# Patient Record
Sex: Female | Born: 1960 | Race: White | Hispanic: No | Smoking: Never smoker
Health system: Southern US, Community
[De-identification: ages and names within clinical notes are randomized; demographics above are authoritative.]

## PROBLEM LIST (undated history)

## (undated) DIAGNOSIS — D219 Benign neoplasm of connective and other soft tissue, unspecified: Secondary | ICD-10-CM

## (undated) DIAGNOSIS — R059 Cough, unspecified: Secondary | ICD-10-CM

## (undated) DIAGNOSIS — R000 Tachycardia, unspecified: Secondary | ICD-10-CM

## (undated) DIAGNOSIS — R05 Cough: Secondary | ICD-10-CM

## (undated) DIAGNOSIS — R002 Palpitations: Secondary | ICD-10-CM

## (undated) DIAGNOSIS — R5383 Other fatigue: Secondary | ICD-10-CM

## (undated) DIAGNOSIS — J309 Allergic rhinitis, unspecified: Secondary | ICD-10-CM

## (undated) DIAGNOSIS — R053 Chronic cough: Secondary | ICD-10-CM

## (undated) DIAGNOSIS — D649 Anemia, unspecified: Secondary | ICD-10-CM

## (undated) HISTORY — PX: CHOLECYSTECTOMY: SHX55

## (undated) HISTORY — DX: Tachycardia, unspecified: R00.0

## (undated) HISTORY — DX: Palpitations: R00.2

## (undated) HISTORY — DX: Other fatigue: R53.83

## (undated) HISTORY — DX: Benign neoplasm of connective and other soft tissue, unspecified: D21.9

## (undated) HISTORY — DX: Allergic rhinitis, unspecified: J30.9

## (undated) HISTORY — PX: WISDOM TOOTH EXTRACTION: SHX21

## (undated) HISTORY — DX: Cough: R05

## (undated) HISTORY — DX: Cough, unspecified: R05.9

---

## 1998-03-08 ENCOUNTER — Observation Stay (HOSPITAL_COMMUNITY): Admission: EM | Admit: 1998-03-08 | Discharge: 1998-03-09 | Payer: Self-pay | Admitting: Gastroenterology

## 1999-04-20 ENCOUNTER — Encounter: Payer: Self-pay | Admitting: Infectious Diseases

## 1999-04-20 ENCOUNTER — Ambulatory Visit (HOSPITAL_COMMUNITY): Admission: RE | Admit: 1999-04-20 | Discharge: 1999-04-20 | Payer: Self-pay | Admitting: Infectious Diseases

## 1999-04-20 ENCOUNTER — Encounter: Admission: RE | Admit: 1999-04-20 | Discharge: 1999-04-20 | Payer: Self-pay | Admitting: Infectious Diseases

## 1999-04-28 ENCOUNTER — Encounter: Admission: RE | Admit: 1999-04-28 | Discharge: 1999-04-28 | Payer: Self-pay | Admitting: Infectious Diseases

## 1999-05-11 ENCOUNTER — Encounter: Admission: RE | Admit: 1999-05-11 | Discharge: 1999-05-11 | Payer: Self-pay | Admitting: Infectious Diseases

## 1999-05-11 ENCOUNTER — Ambulatory Visit (HOSPITAL_COMMUNITY): Admission: RE | Admit: 1999-05-11 | Discharge: 1999-05-11 | Payer: Self-pay | Admitting: Infectious Diseases

## 2004-10-09 ENCOUNTER — Other Ambulatory Visit: Admission: RE | Admit: 2004-10-09 | Discharge: 2004-10-09 | Payer: Self-pay | Admitting: Obstetrics and Gynecology

## 2005-10-16 ENCOUNTER — Other Ambulatory Visit: Admission: RE | Admit: 2005-10-16 | Discharge: 2005-10-16 | Payer: Self-pay | Admitting: Obstetrics and Gynecology

## 2007-02-11 ENCOUNTER — Other Ambulatory Visit: Admission: RE | Admit: 2007-02-11 | Discharge: 2007-02-11 | Payer: Self-pay | Admitting: Obstetrics and Gynecology

## 2008-09-07 ENCOUNTER — Other Ambulatory Visit: Admission: RE | Admit: 2008-09-07 | Discharge: 2008-09-07 | Payer: Self-pay | Admitting: Obstetrics and Gynecology

## 2009-05-30 ENCOUNTER — Encounter: Admission: RE | Admit: 2009-05-30 | Discharge: 2009-05-30 | Payer: Self-pay | Admitting: Family Medicine

## 2009-05-30 IMAGING — CR DG CHEST 2V
2 series · 2 of 2 positions shown · non-contrast
Comparison: None

CLINICAL DATA: Cough

CHEST - 2 VIEW

[view not recorded (1 of 2)]
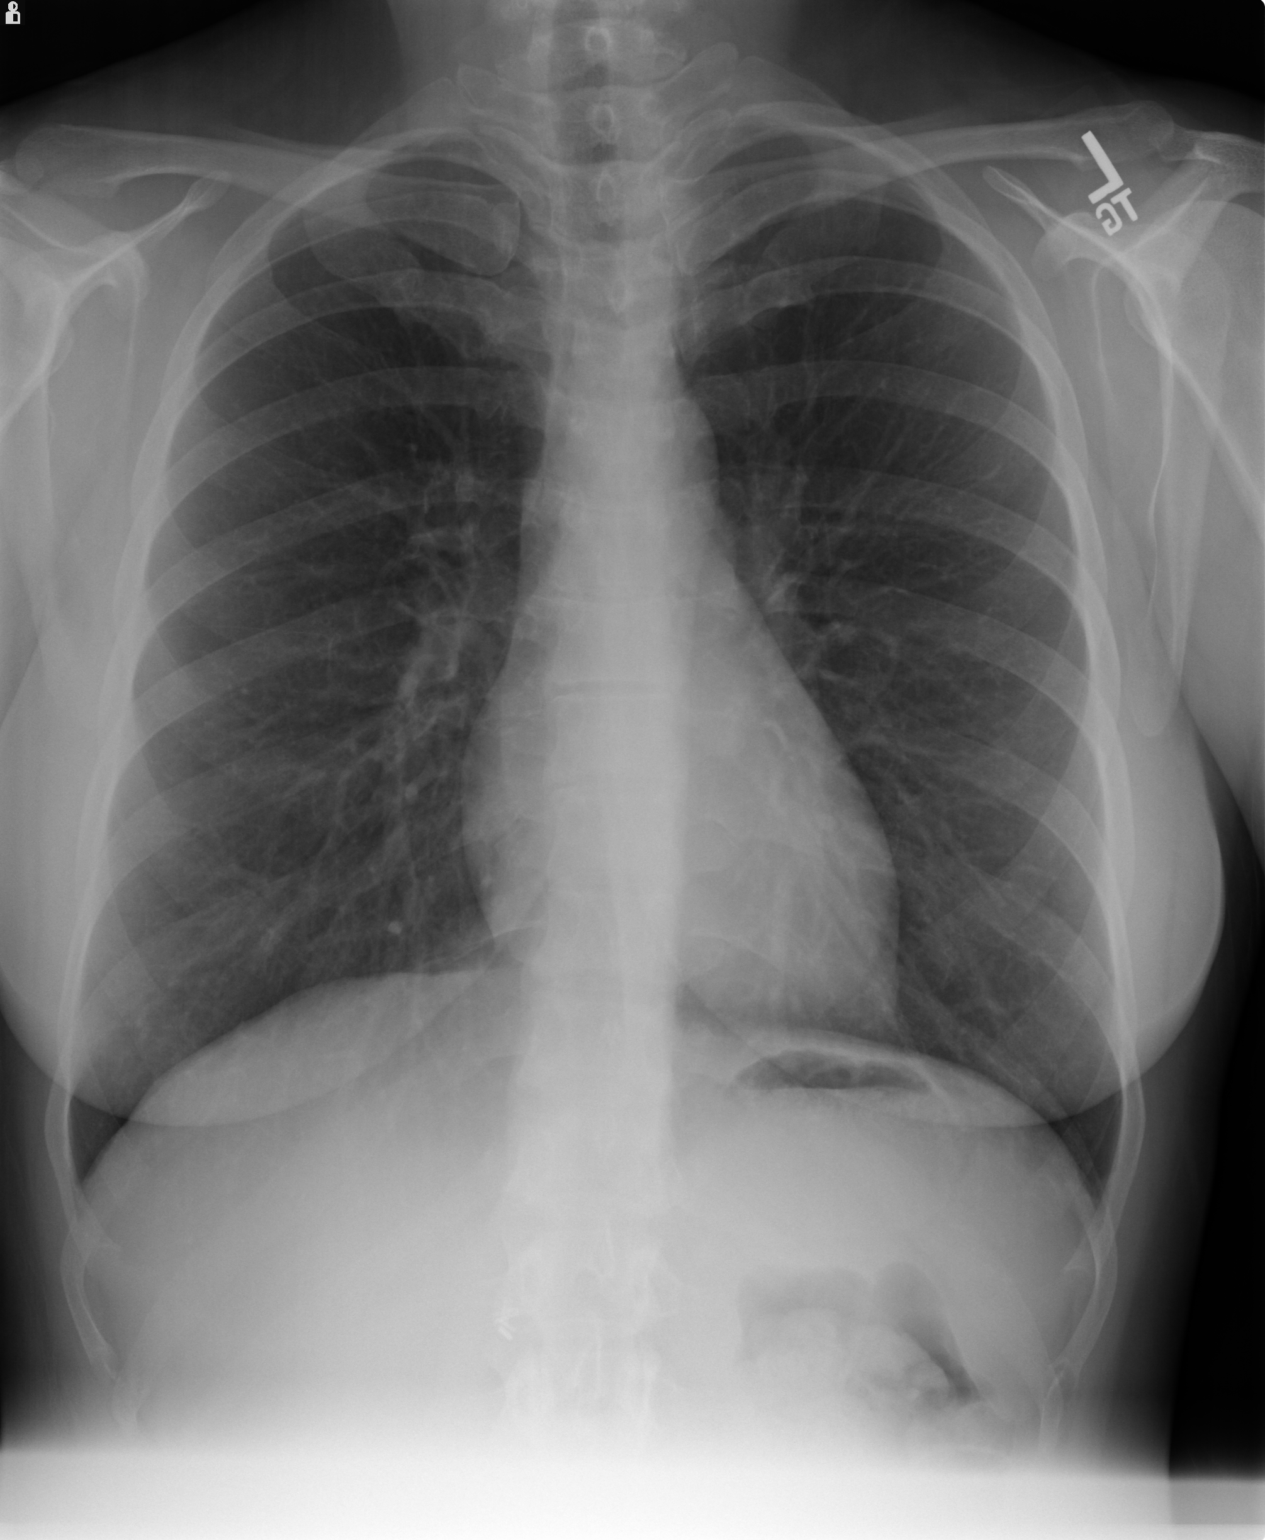

[view not recorded (2 of 2)]
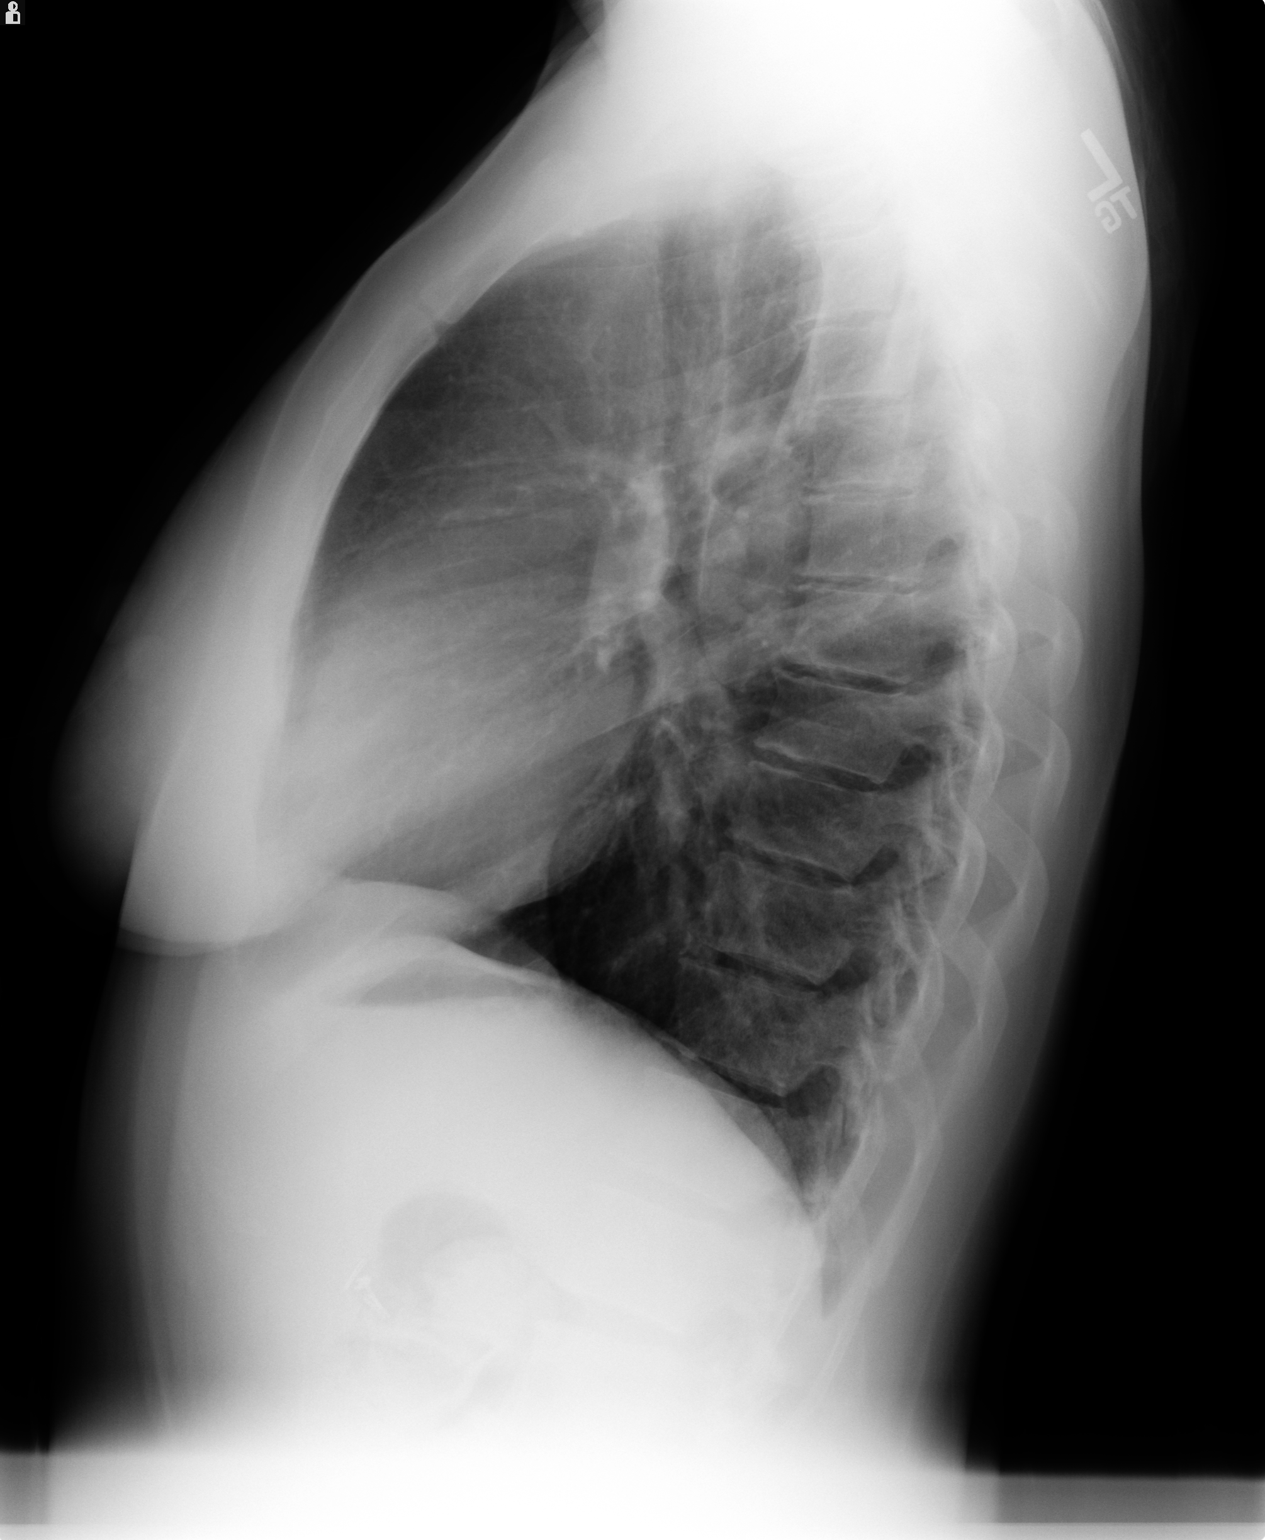

[2 of 2 positions shown; findings below may reference images not displayed]

FINDINGS: The heart size and mediastinal contours are within normal
limits.  Both lungs are clear.  The visualized skeletal structures
are unremarkable.
IMPRESSION: No acute cardiopulmonary abnormalities.

## 2009-12-21 ENCOUNTER — Other Ambulatory Visit: Admission: RE | Admit: 2009-12-21 | Discharge: 2009-12-21 | Payer: Self-pay | Admitting: Obstetrics and Gynecology

## 2010-10-22 ENCOUNTER — Encounter: Payer: Self-pay | Admitting: Obstetrics and Gynecology

## 2010-12-26 ENCOUNTER — Other Ambulatory Visit: Payer: Self-pay | Admitting: Nurse Practitioner

## 2010-12-26 ENCOUNTER — Other Ambulatory Visit (HOSPITAL_COMMUNITY)
Admission: RE | Admit: 2010-12-26 | Discharge: 2010-12-26 | Disposition: A | Discharge status: Home / Self Care | Payer: BC Managed Care – PPO | Source: Ambulatory Visit | Attending: Obstetrics and Gynecology | Admitting: Obstetrics and Gynecology

## 2010-12-26 DIAGNOSIS — Z01419 Encounter for gynecological examination (general) (routine) without abnormal findings: Secondary | ICD-10-CM | POA: Insufficient documentation

## 2011-05-08 ENCOUNTER — Other Ambulatory Visit: Payer: Self-pay | Admitting: Obstetrics and Gynecology

## 2011-05-08 DIAGNOSIS — Z1231 Encounter for screening mammogram for malignant neoplasm of breast: Secondary | ICD-10-CM

## 2011-05-14 ENCOUNTER — Ambulatory Visit
Admission: RE | Admit: 2011-05-14 | Discharge: 2011-05-14 | Disposition: A | Discharge status: Home / Self Care | Payer: Commercial Indemnity | Source: Ambulatory Visit | Attending: Obstetrics and Gynecology | Admitting: Obstetrics and Gynecology

## 2011-05-14 DIAGNOSIS — Z1231 Encounter for screening mammogram for malignant neoplasm of breast: Secondary | ICD-10-CM

## 2012-05-01 ENCOUNTER — Other Ambulatory Visit (HOSPITAL_COMMUNITY)
Admission: RE | Admit: 2012-05-01 | Discharge: 2012-05-01 | Disposition: A | Discharge status: Home / Self Care | Payer: Managed Care, Other (non HMO) | Source: Ambulatory Visit | Attending: Obstetrics and Gynecology | Admitting: Obstetrics and Gynecology

## 2012-05-01 ENCOUNTER — Other Ambulatory Visit: Payer: Self-pay | Admitting: Nurse Practitioner

## 2012-05-01 DIAGNOSIS — Z01419 Encounter for gynecological examination (general) (routine) without abnormal findings: Secondary | ICD-10-CM | POA: Insufficient documentation

## 2012-11-17 ENCOUNTER — Ambulatory Visit
Admission: RE | Admit: 2012-11-17 | Discharge: 2012-11-17 | Disposition: A | Discharge status: Home / Self Care | Payer: Managed Care, Other (non HMO) | Source: Ambulatory Visit | Attending: Family Medicine | Admitting: Family Medicine

## 2012-11-17 ENCOUNTER — Other Ambulatory Visit: Payer: Self-pay | Admitting: Family Medicine

## 2012-11-17 DIAGNOSIS — R1031 Right lower quadrant pain: Secondary | ICD-10-CM

## 2012-11-17 DIAGNOSIS — R102 Pelvic and perineal pain: Secondary | ICD-10-CM
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
## 2013-07-03 ENCOUNTER — Other Ambulatory Visit: Payer: Self-pay | Admitting: Family Medicine

## 2013-07-03 ENCOUNTER — Ambulatory Visit
Admission: RE | Admit: 2013-07-03 | Discharge: 2013-07-03 | Disposition: A | Discharge status: Home / Self Care | Payer: 59 | Source: Ambulatory Visit | Attending: Family Medicine | Admitting: Family Medicine

## 2013-07-03 DIAGNOSIS — R05 Cough: Secondary | ICD-10-CM

## 2013-07-03 DIAGNOSIS — R059 Cough, unspecified: Secondary | ICD-10-CM

## 2013-07-03 IMAGING — CR DG CHEST 2V
2 series · 2 of 2 positions shown · non-contrast
Comparison: [DATE].

CLINICAL DATA: Worsening cough. Family history of lung cancer.

EXAM:
CHEST  2 VIEW

[view not recorded (1 of 2)]
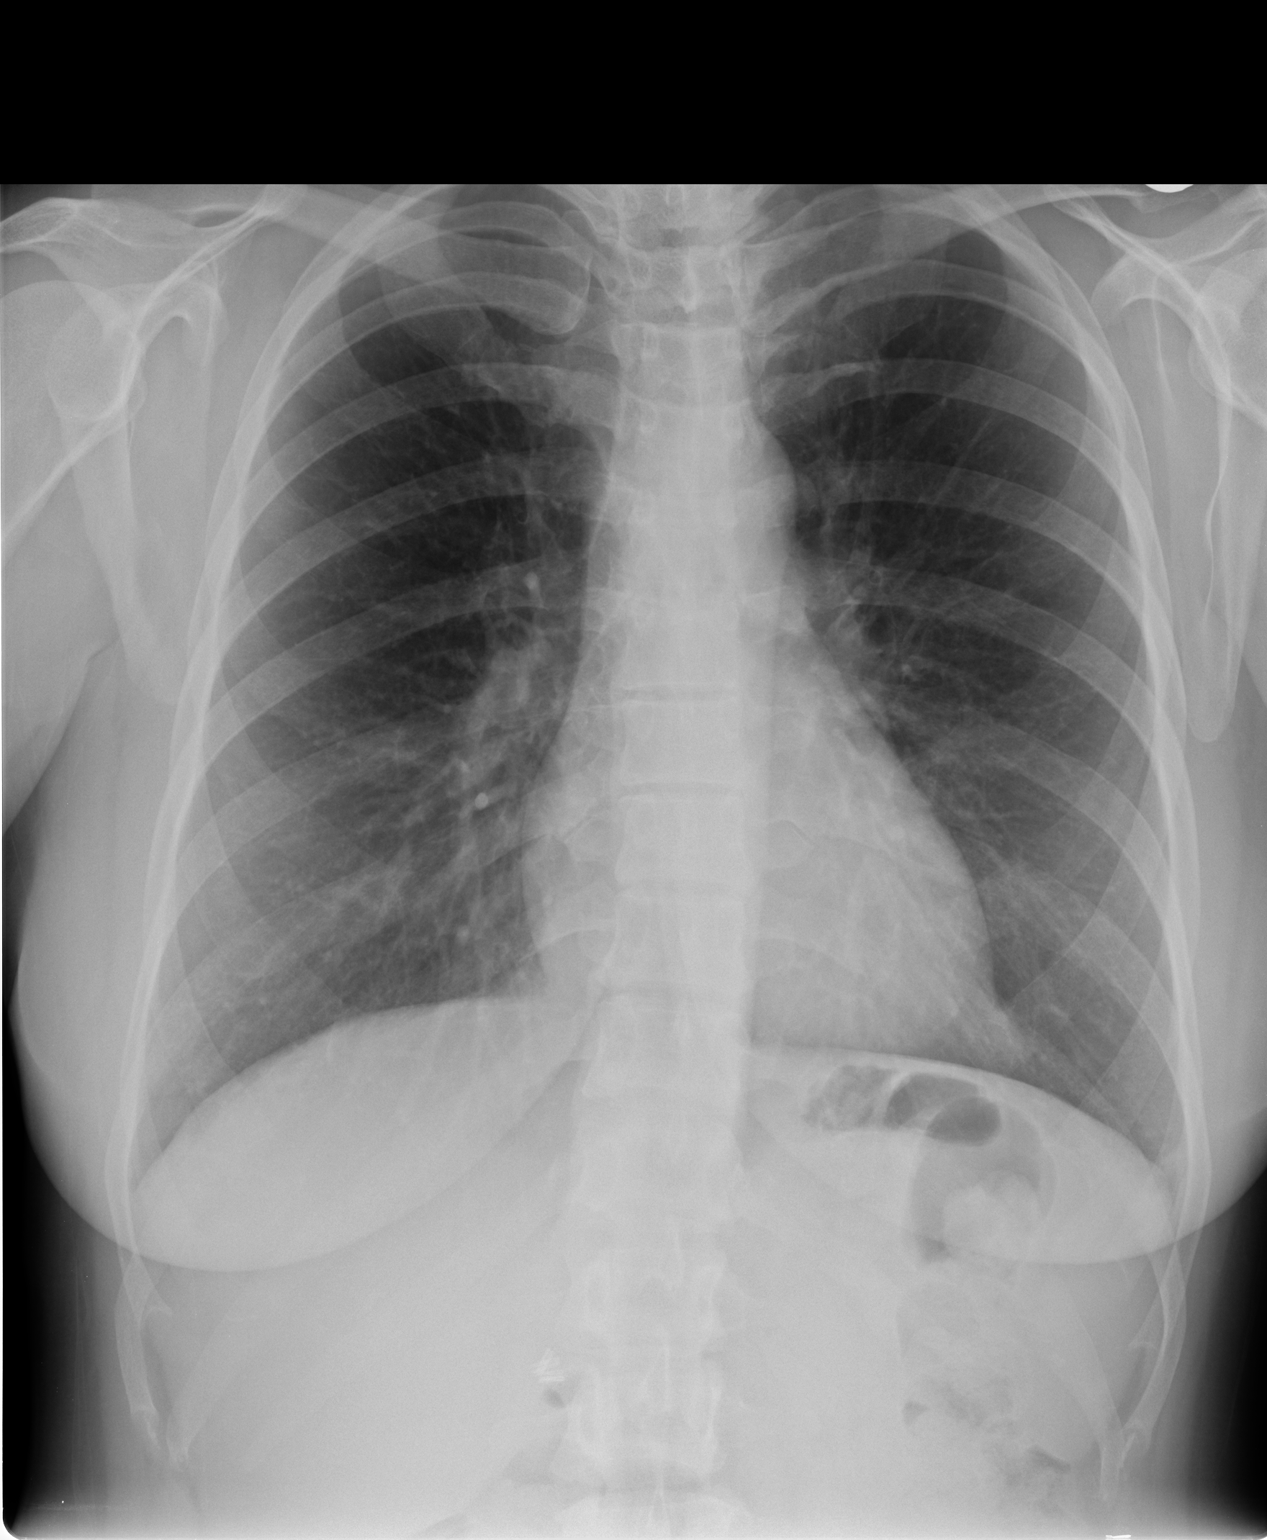

[view not recorded (2 of 2)]
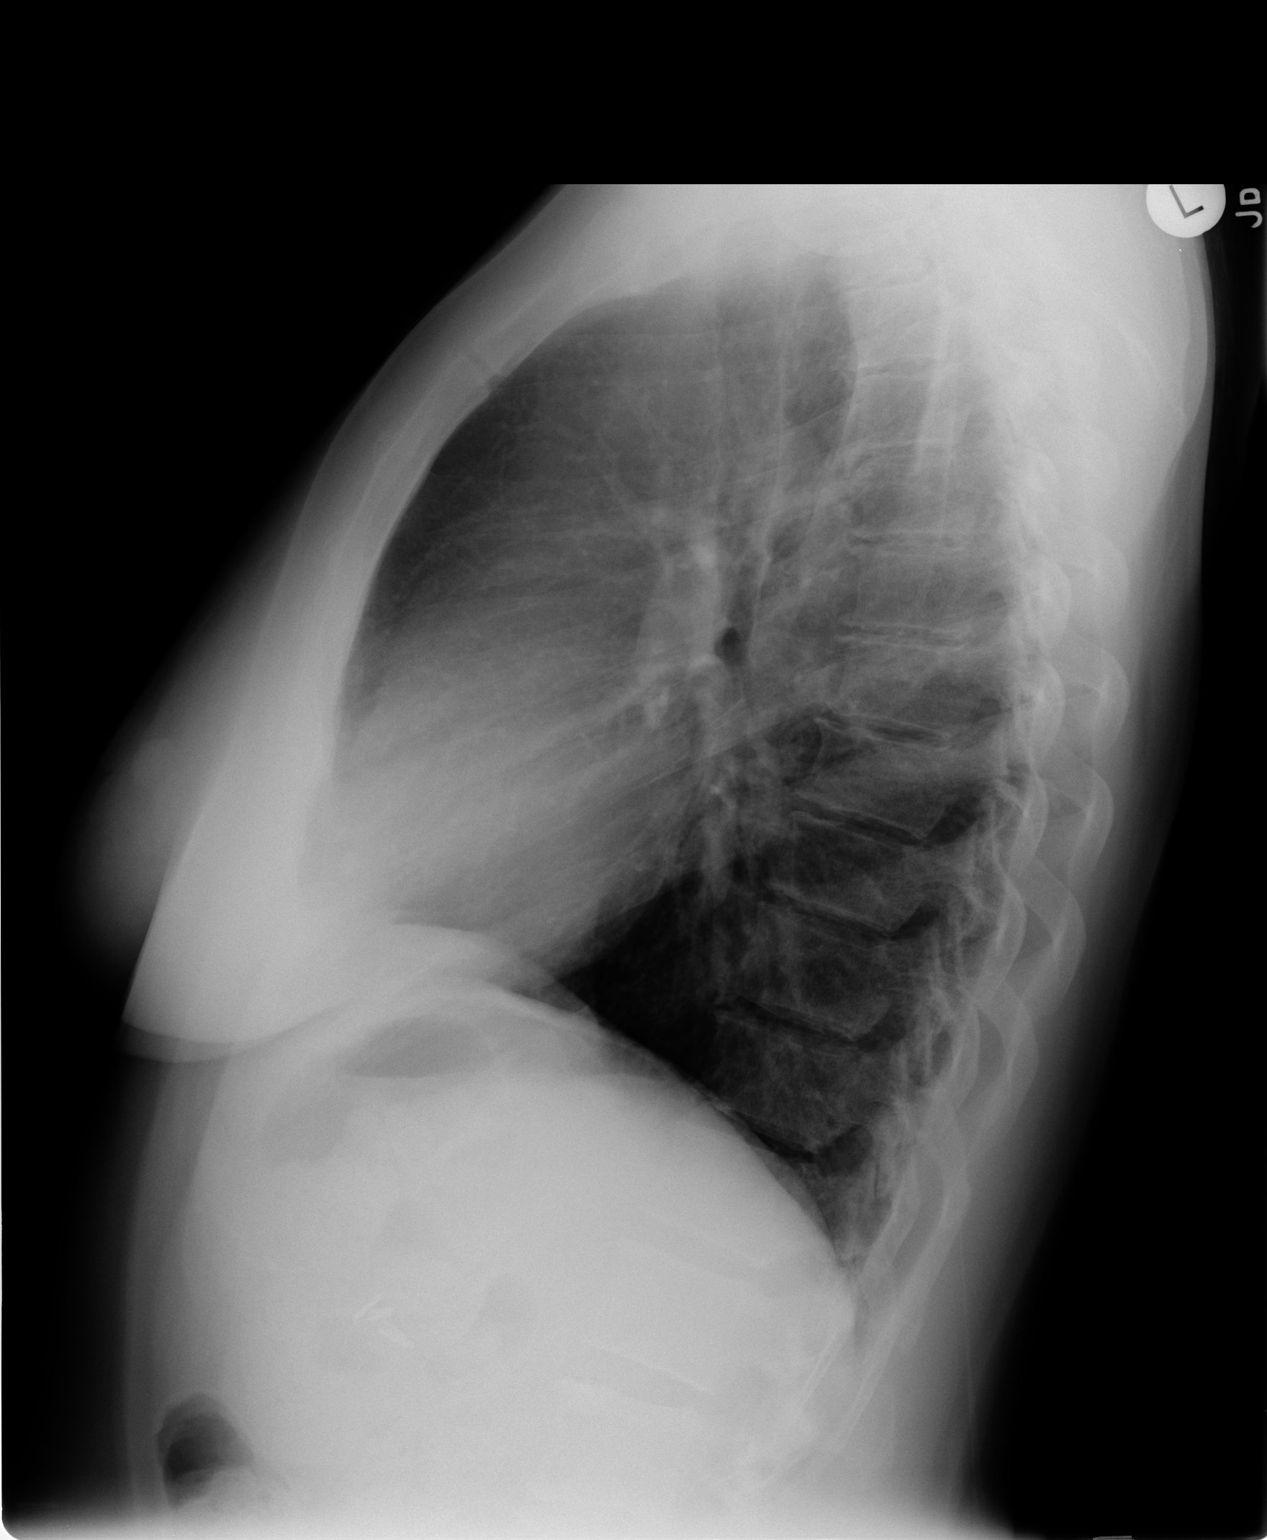

[2 of 2 positions shown; findings below may reference images not displayed]

FINDINGS: Trachea is midline. Heart size normal. Lungs may be mildly
hyperinflated but are clear. No pleural fluid.
IMPRESSION: No acute findings.

## 2013-11-19 ENCOUNTER — Emergency Department (HOSPITAL_COMMUNITY)
Admission: EM | Admit: 2013-11-19 | Discharge: 2013-11-19 | Disposition: A | Discharge status: Home / Self Care | Payer: Managed Care, Other (non HMO) | Attending: Emergency Medicine | Admitting: Emergency Medicine

## 2013-11-19 ENCOUNTER — Encounter (HOSPITAL_COMMUNITY): Payer: Self-pay | Admitting: Emergency Medicine

## 2013-11-19 ENCOUNTER — Emergency Department (HOSPITAL_COMMUNITY): Payer: Managed Care, Other (non HMO)

## 2013-11-19 DIAGNOSIS — S139XXA Sprain of joints and ligaments of unspecified parts of neck, initial encounter: Secondary | ICD-10-CM | POA: Insufficient documentation

## 2013-11-19 DIAGNOSIS — Y92410 Unspecified street and highway as the place of occurrence of the external cause: Secondary | ICD-10-CM | POA: Insufficient documentation

## 2013-11-19 DIAGNOSIS — Y9389 Activity, other specified: Secondary | ICD-10-CM | POA: Insufficient documentation

## 2013-11-19 DIAGNOSIS — S199XXA Unspecified injury of neck, initial encounter: Secondary | ICD-10-CM | POA: Diagnosis present

## 2013-11-19 DIAGNOSIS — R0789 Other chest pain: Secondary | ICD-10-CM

## 2013-11-19 DIAGNOSIS — S298XXA Other specified injuries of thorax, initial encounter: Secondary | ICD-10-CM | POA: Insufficient documentation

## 2013-11-19 DIAGNOSIS — S161XXA Strain of muscle, fascia and tendon at neck level, initial encounter: Secondary | ICD-10-CM

## 2013-11-19 DIAGNOSIS — S0993XA Unspecified injury of face, initial encounter: Secondary | ICD-10-CM | POA: Diagnosis present
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
## 2013-11-19 MED ORDER — DIAZEPAM 5 MG PO TABS: 5.0000 mg | ORAL_TABLET | Freq: Three times a day (TID) | ORAL | Status: DC | PRN

## 2013-11-19 MED ORDER — OXYCODONE-ACETAMINOPHEN 5-325 MG PO TABS: 1.0000 | ORAL_TABLET | ORAL | Status: DC | PRN

## 2013-11-19 MED ORDER — IBUPROFEN 800 MG PO TABS: 800.0000 mg | ORAL_TABLET | Freq: Three times a day (TID) | ORAL | Status: DC | PRN

## 2013-11-19 MED ORDER — OXYCODONE-ACETAMINOPHEN 5-325 MG PO TABS
2.0000 | ORAL_TABLET | Freq: Once | ORAL | Status: AC
  Administered 2013-11-19: 2 via ORAL
  Filled 2013-11-19: qty 2

## 2013-11-19 NOTE — ED Notes (Signed)
Patient transported to X-ray 
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
## 2013-11-19 NOTE — Discharge Instructions (Signed)
Cervical Sprain A cervical sprain is an injury in the neck in which the strong, fibrous tissues (ligaments) that connect your neck bones stretch or tear. Cervical sprains can range from mild to severe. Severe cervical sprains can cause the neck vertebrae to be unstable. This can lead to damage of the spinal cord and can result in serious nervous system problems. The amount of time it takes for a cervical sprain to get better depends on the cause and extent of the injury. Most cervical sprains heal in 1 to 3 weeks. CAUSES  Severe cervical sprains may be caused by:   Contact sport injuries (such as from football, rugby, wrestling, hockey, auto racing, gymnastics, diving, martial arts, or boxing).   Motor vehicle collisions.   Whiplash injuries. This is an injury from a sudden forward-and backward whipping movement of the head and neck.  Falls.  Mild cervical sprains may be caused by:   Being in an awkward position, such as while cradling a telephone between your ear and shoulder.   Sitting in a chair that does not offer proper support.   Working at a poorly Landscape [employer] station.   Looking up or down for long periods of time.  SYMPTOMS   Pain, soreness, stiffness, or a burning sensation in the front, back, or sides of the neck. This discomfort may develop immediately after the injury or slowly, 24 hours or more after the injury.   Pain or tenderness directly in the middle of the back of the neck.   Shoulder or upper back pain.   Limited ability to move the neck.   Headache.   Dizziness.   Weakness, numbness, or tingling in the hands or arms.   Muscle spasms.   Difficulty swallowing or chewing.   Tenderness and swelling of the neck.  DIAGNOSIS  Most of the time your health care provider can diagnose a cervical sprain by taking your history and doing a physical exam. Your health care provider will ask about previous neck injuries and any known neck  problems, such as arthritis in the neck. X-rays may be taken to find out if there are any other problems, such as with the bones of the neck. Other tests, such as a CT scan or MRI, may also be needed.  TREATMENT  Treatment depends on the severity of the cervical sprain. Mild sprains can be treated with rest, keeping the neck in place (immobilization), and pain medicines. Severe cervical sprains are immediately immobilized. Further treatment is done to help with pain, muscle spasms, and other symptoms and may include:  Medicines, such as pain relievers, numbing medicines, or muscle relaxants.   Physical therapy. This may involve stretching exercises, strengthening exercises, and posture training. Exercises and improved posture can help stabilize the neck, strengthen muscles, and help stop symptoms from returning.  HOME CARE INSTRUCTIONS   Put ice on the injured area.   Put ice in a plastic bag.   Place a towel between your skin and the bag.   Leave the ice on for 15 20 minutes, 3 4 times a day.   If your injury was severe, you may have been given a cervical collar to wear. A cervical collar is a two-piece collar designed to keep your neck from moving while it heals.  Do not remove the collar unless instructed by your health care provider.  If you have long hair, keep it outside of the collar.  Ask your health care provider before making any adjustments to your collar.  Minor adjustments may be required over time to improve comfort and reduce pressure on your chin or on the back of your head.  Ifyou are allowed to remove the collar for cleaning or bathing, follow your health care provider's instructions on how to do so safely.  Keep your collar clean by wiping it with mild soap and water and drying it completely. If the collar you have been given includes removable pads, remove them every 1 2 days and hand wash them with soap and water. Allow them to air dry. They should be completely  dry before you wear them in the collar.  If you are allowed to remove the collar for cleaning and bathing, wash and dry the skin of your neck. Check your skin for irritation or sores. If you see any, tell your health care provider.  Do not drive while wearing the collar.   Only take over-the-counter or prescription medicines for pain, discomfort, or fever as directed by your health care provider.   Keep all follow-up appointments as directed by your health care provider.   Keep all physical therapy appointments as directed by your health care provider.   Make any needed adjustments to your workstation to promote good posture.   Avoid positions and activities that make your symptoms worse.   Warm up and stretch before being active to help prevent problems.  SEEK MEDICAL CARE IF:   Your pain is not controlled with medicine.   You are unable to decrease your pain medicine over time as planned.   Your activity level is not improving as expected.  SEEK IMMEDIATE MEDICAL CARE IF:   You develop any bleeding.  You develop stomach upset.  You have signs of an allergic reaction to your medicine.   Your symptoms get worse.   You develop new, unexplained symptoms.   You have numbness, tingling, weakness, or paralysis in any part of your body.  MAKE SURE YOU:   Understand these instructions.  Will watch your condition.  Will get help right away if you are not doing well or get worse. Document Released: 07/15/2007 Document Revised: 07/08/2013 Document Reviewed: 03/25/2013 [facility] Patient Information 2014 [place].  Chest Wall Pain Chest wall pain is pain in or around the bones and muscles of your chest. It may take up to 6 weeks to get better. It may take longer if you must stay physically active in your work and activities.  CAUSES  Chest wall pain may happen on its own. However, it may be caused by:  A viral illness like the  flu.  Injury.  Coughing.  Exercise.  Arthritis.  Fibromyalgia.  Shingles. HOME CARE INSTRUCTIONS   Avoid overtiring physical activity. Try not to strain or perform activities that cause pain. This includes any activities using your chest or your abdominal and side muscles, especially if heavy weights are used.  Put ice on the sore area.  Put ice in a plastic bag.  Place a towel between your skin and the bag.  Leave the ice on for 15-20 minutes per hour while awake for the first 2 days.  Only take over-the-counter or prescription medicines for pain, discomfort, or fever as directed by your caregiver. SEEK IMMEDIATE MEDICAL CARE IF:   Your pain increases, or you are very uncomfortable.  You have a fever.  Your chest pain becomes worse.  You have new, unexplained symptoms.  You have nausea or vomiting.  You feel sweaty or lightheaded.  You have a cough with  phlegm (sputum), or you cough up blood. MAKE SURE YOU:   Understand these instructions.  Will watch your condition.  Will get help right away if you are not doing well or get worse. Document Released: 09/17/2005 Document Revised: 12/10/2011 Document Reviewed: 05/14/2011 [facility] Patient Information 2014 [place], [place].  Motor Vehicle Collision  It is common to have multiple bruises and sore muscles after a motor vehicle collision (MVC). These tend to feel worse for the first 24 hours. You may have the most stiffness and soreness over the first several hours. You may also feel worse when you wake up the first morning after your collision. After this point, you will usually begin to improve with each day. The speed of improvement often depends on the severity of the collision, the number of injuries, and the location and nature of these injuries. HOME CARE INSTRUCTIONS   Put ice on the injured area.  Put ice in a plastic bag.  Place a towel between your skin and the bag.  Leave the ice on for 15-20 minutes,  03-04 times a day.  Drink enough fluids to keep your urine clear or pale yellow. Do not drink alcohol.  Take a warm shower or bath once or twice a day. This will increase blood flow to sore muscles.  You may return to activities as directed by your caregiver. Be careful when lifting, as this may aggravate neck or back pain.  Only take over-the-counter or prescription medicines for pain, discomfort, or fever as directed by your caregiver. Do not use aspirin. This may increase bruising and bleeding. SEEK IMMEDIATE MEDICAL CARE IF:  You have numbness, tingling, or weakness in the arms or legs.  You develop severe headaches not relieved with medicine.  You have severe neck pain, especially tenderness in the middle of the back of your neck.  You have changes in bowel or bladder control.  There is increasing pain in any area of the body.  You have shortness of breath, lightheadedness, dizziness, or fainting.  You have chest pain.  You feel sick to your stomach (nauseous), throw up (vomit), or sweat.  You have increasing abdominal discomfort.  There is blood in your urine, stool, or vomit.  You have pain in your shoulder (shoulder strap areas).  You feel your symptoms are getting worse. MAKE SURE YOU:   Understand these instructions.  Will watch your condition.  Will get help right away if you are not doing well or get worse. Document Released: 09/17/2005 Document Revised: 12/10/2011 Document Reviewed: 02/14/2011 [facility] Patient Information 2014 [place], [place].

## 2014-05-07 ENCOUNTER — Ambulatory Visit
Admission: RE | Admit: 2014-05-07 | Discharge: 2014-05-07 | Disposition: A | Discharge status: Home / Self Care | Payer: Managed Care, Other (non HMO) | Source: Ambulatory Visit | Attending: Family Medicine | Admitting: Family Medicine

## 2014-05-07 ENCOUNTER — Other Ambulatory Visit: Payer: Self-pay | Admitting: Family Medicine

## 2014-05-07 DIAGNOSIS — R059 Cough, unspecified: Secondary | ICD-10-CM

## 2014-05-07 DIAGNOSIS — R05 Cough: Secondary | ICD-10-CM

## 2014-05-07 IMAGING — CR DG CHEST 2V
2 series · 2 of 2 positions shown · non-contrast
Comparison: None.

CLINICAL DATA: Chronic cough, congestion

EXAM:
CHEST  2 VIEW

[w chest pa]
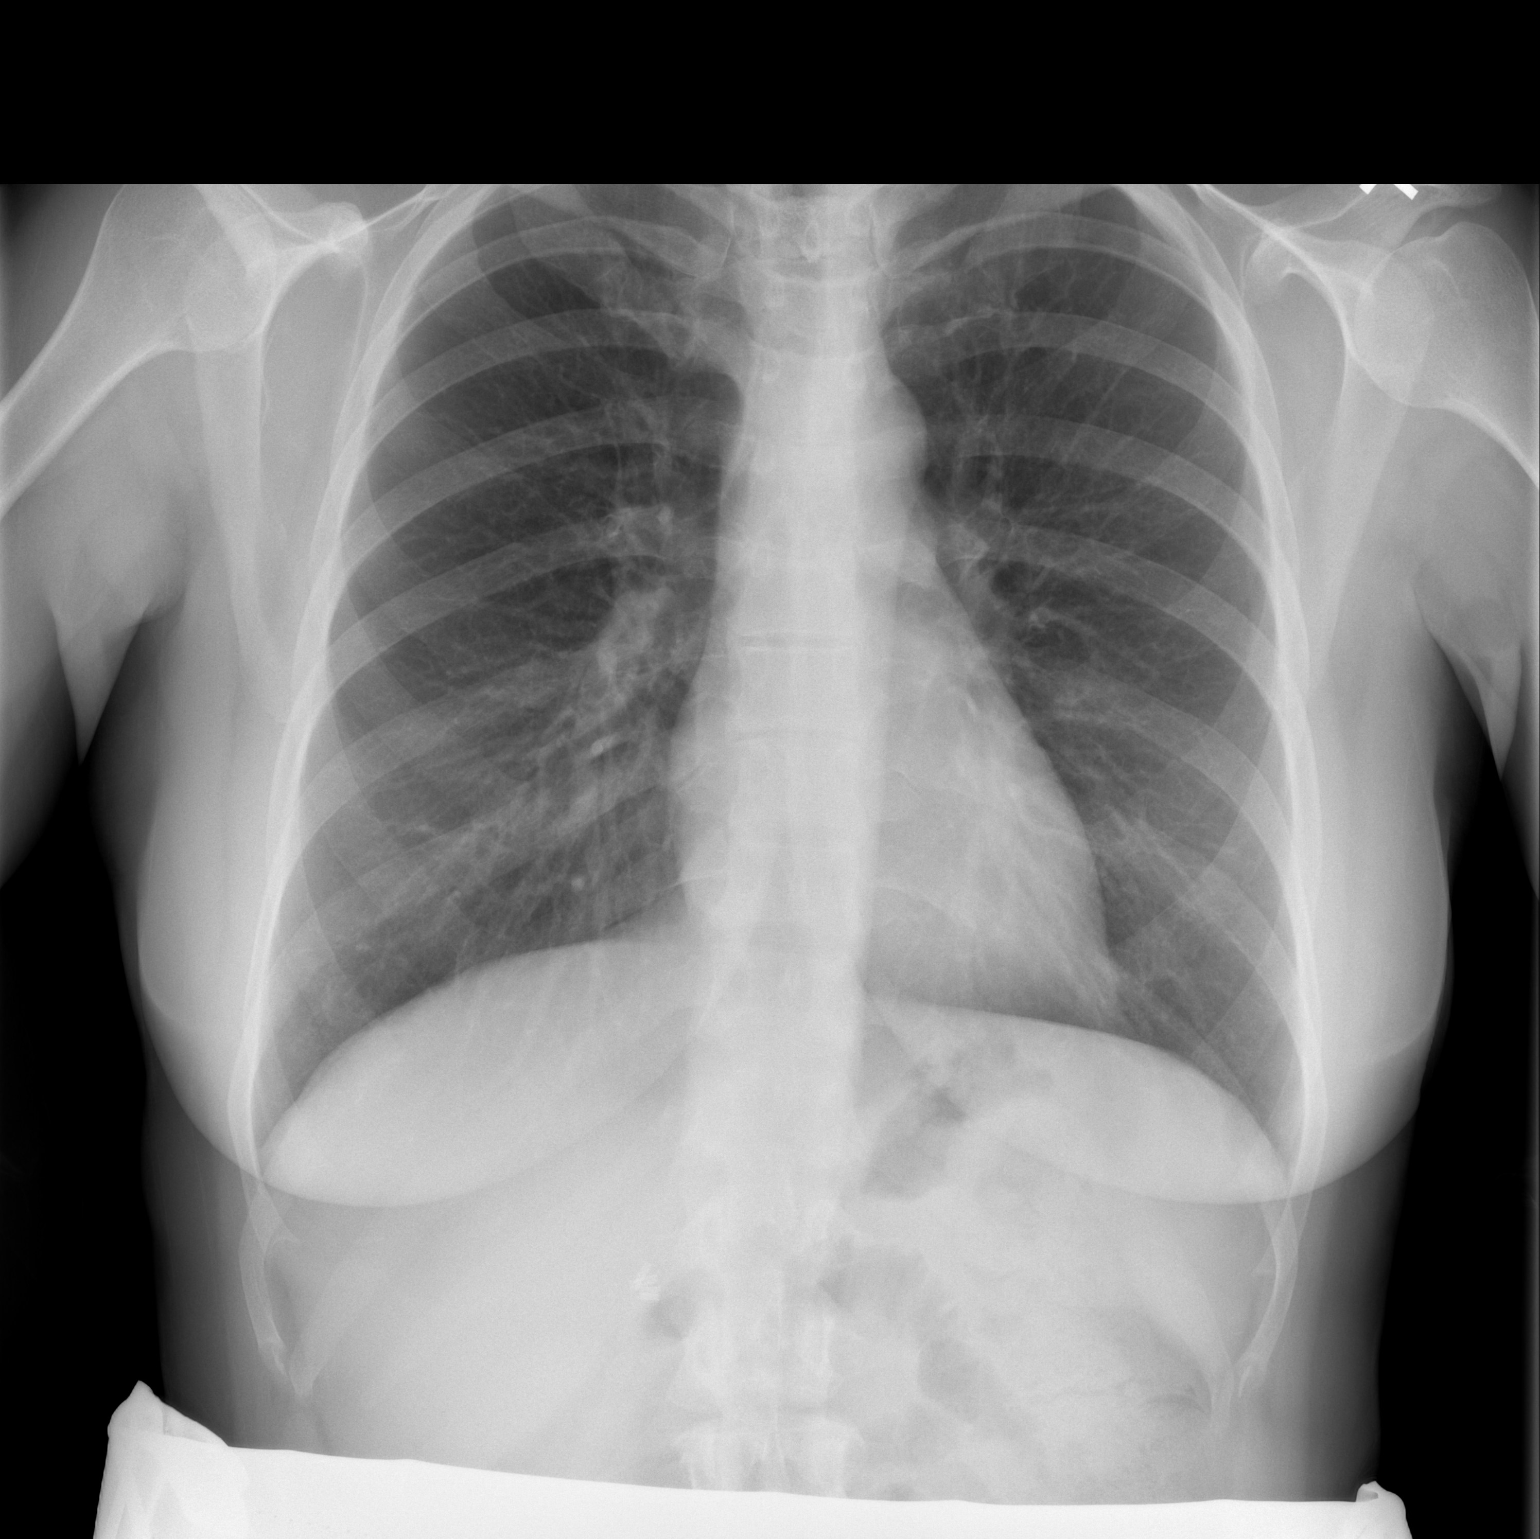

[w chest lat]
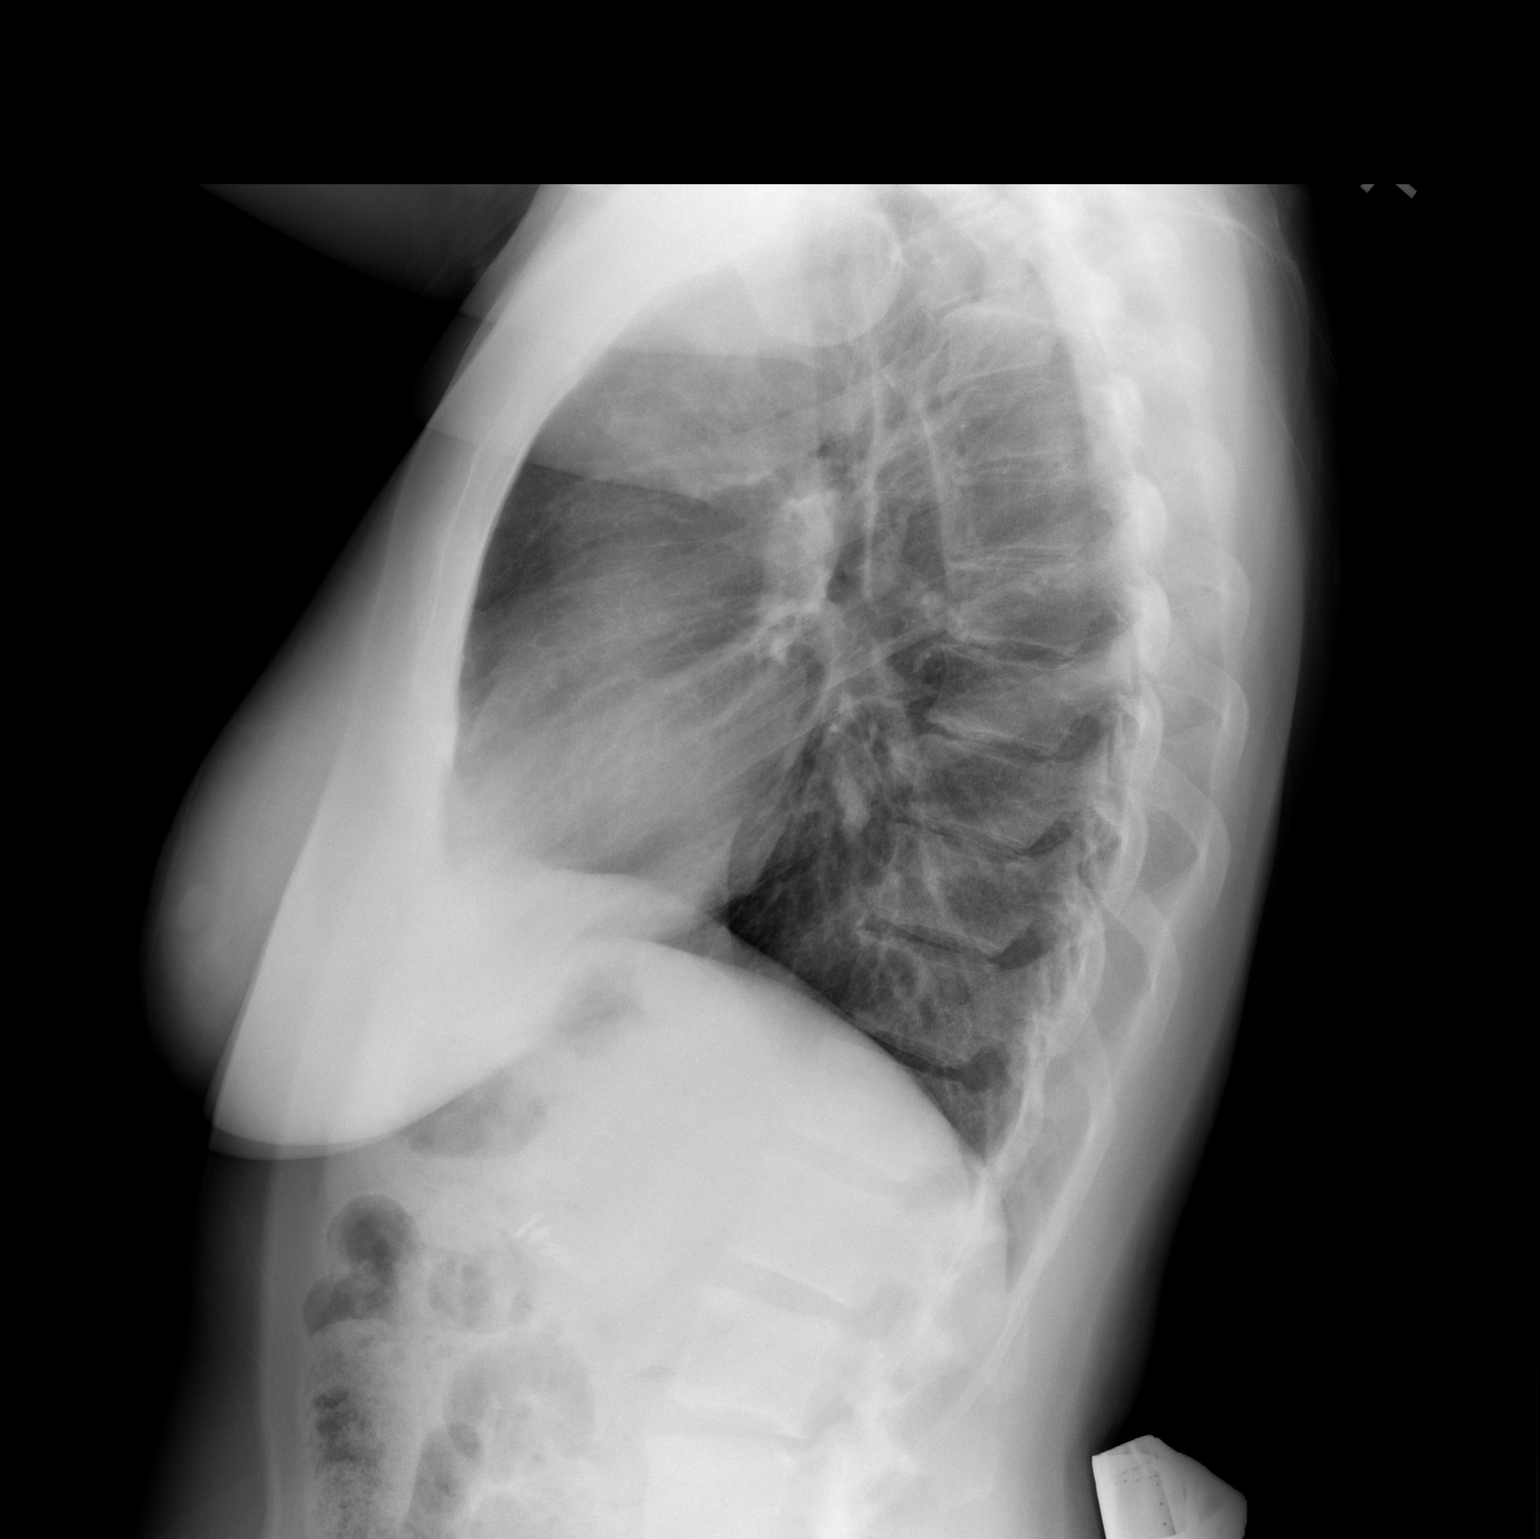

[2 of 2 positions shown; findings below may reference images not displayed]

FINDINGS: The lungs are clear and negative for focal airspace consolidation,
pulmonary edema or suspicious pulmonary nodule. No pleural effusion
or pneumothorax. Cardiac and mediastinal contours are within normal
limits. No acute fracture or lytic or blastic osseous lesions. The
visualized upper abdominal bowel gas pattern is unremarkable.
Surgical clips in the right upper quadrant suggest prior
cholecystectomy.
IMPRESSION: No active cardiopulmonary disease.

## 2014-05-13 ENCOUNTER — Institutional Professional Consult (permissible substitution): Payer: Managed Care, Other (non HMO) | Admitting: Pulmonary Disease

## 2014-05-17 ENCOUNTER — Encounter: Payer: Self-pay | Admitting: Internal Medicine

## 2014-05-18 ENCOUNTER — Encounter: Payer: Self-pay | Admitting: Internal Medicine

## 2014-05-18 ENCOUNTER — Ambulatory Visit (INDEPENDENT_AMBULATORY_CARE_PROVIDER_SITE_OTHER): Payer: Managed Care, Other (non HMO) | Admitting: Internal Medicine

## 2014-05-18 VITALS — BP 140/88 | HR 109 | Temp 99.4°F | Ht 66.5 in | Wt 174.4 lb

## 2014-05-18 DIAGNOSIS — R058 Other specified cough: Secondary | ICD-10-CM

## 2014-05-18 DIAGNOSIS — R059 Cough, unspecified: Secondary | ICD-10-CM

## 2014-05-18 DIAGNOSIS — R05 Cough: Secondary | ICD-10-CM

## 2014-05-18 MED ORDER — PREDNISONE 10 MG PO TABS: ORAL_TABLET | ORAL | Status: DC

## 2014-05-18 MED ORDER — FAMOTIDINE 20 MG PO TABS: ORAL_TABLET | ORAL | Status: DC

## 2014-05-18 MED ORDER — PANTOPRAZOLE SODIUM 40 MG PO TBEC: 40.0000 mg | DELAYED_RELEASE_TABLET | Freq: Every day | ORAL | Status: DC

## 2014-05-18 NOTE — Progress Notes (Signed)
Subjective:    Patient ID: Abigail Love, female    DOB: 19-Nov-1960,   MRN: [id number]  HPI  39 yowf never smoker never problems as child but starting in late 1990's recurrent "bronchitis" requiring zpak typically with onset of cold weather that resolved p around 2006 then cough sarted p dog moved in around 2009 not resoponsive to otcs or gerd rx with allergy tests by [facility] pos mold only rx (same house since 1992)   05/18/2014 1st [facility] office visit/ Melvyn Novas /  Chief Complaint  Patient presents with  . PULMONARY CONSULT    Referred by Dr Jonny Ruiz for Chronic Cough x 5 year, progressively worse.   cough is 24/7, mostly non productive. Really not short of breath unless coughing. Chest tightness with exp to mold ? Better  qvar singulair no better saba helps tightness but not cough Almost vomit Tried acid suppression  Kouffman Reflux v Neurogenic Cough Differentiator Reflux Comments  Do you awaken from a sound sleep coughing violently?                            With trouble breathing? occ   Do you have choking episodes when you cannot  Get enough air, gasping for air ?              Yes   Do you usually cough when you lie down into  The bed, or when you just lie down to rest ?                          sometimes   Do you usually cough after meals or eating?         no   Do you cough when (or after) you bend over?    Yes   GERD SCORE     Kouffman Reflux v Neurogenic Cough Differentiator Neurogenic   Do you more-or-less cough all day long? yes   Does change of temperature make you cough? no   Does laughing or chuckling cause you to cough? yes   Do fumes (perfume, automobile fumes, burned  Toast, etc.,) cause you to cough ?      Not aware   Does speaking, singing, or talking on the phone cause you to cough   ?               sometimes   Neurogenic/Airway score       No obvious other patterns in day to day or daytime variabilty or assoc  cp or chest tightness, subjective  wheeze overt sinus or hb symptoms. No unusual exp hx or h/o childhood pna/ asthma or knowledge of premature birth.  Sleeping ok without nocturnal  or early am exacerbation  of respiratory  c/o's or need for noct saba. Also denies any obvious fluctuation of symptoms with weather or environmental changes or other aggravating or alleviating factors except as outlined above   Current Medications, Allergies, Complete Past Medical History, Past Surgical History, Family History, and Social History were reviewed in [employer] record.           Review of Systems  Constitutional: Negative for fever and unexpected weight change.  HENT: Positive for congestion ( chest congestion). Negative for dental problem, ear pain, nosebleeds, postnasal drip, rhinorrhea, sinus pressure, sneezing, sore throat and trouble swallowing.   Eyes: Negative for redness and itching.  Respiratory: Positive for cough, chest  tightness, shortness of breath and wheezing.   Cardiovascular: Positive for chest pain. Negative for palpitations and leg swelling.  Gastrointestinal: Negative for nausea and vomiting.  Genitourinary: Negative for dysuria.  Musculoskeletal: Negative for joint swelling.  Skin: Negative for rash.  Neurological: Negative for headaches.  Hematological: Does not bruise/bleed easily.  Psychiatric/Behavioral: Negative for dysphoric mood. The patient is not nervous/anxious.        Objective:   Physical Exam   amb wf nad  Wt Readings from Last 3 Encounters:  05/18/14 174 lb 6.4 oz (79.107 kg)       HEENT: nl dentition, turbinates, and orophanx. Nl external ear canals without cough reflex   NECK :  without JVD/Nodes/TM/ nl carotid upstrokes bilaterally   LUNGS: no acc muscle use, clear to A and P bilaterally without cough on insp or exp maneuvers   CV:  RRR  no s3 or murmur or increase in P2, no edema   ABD:  soft and nontender with nl excursion in the supine position. No  bruits or organomegaly, bowel sounds nl  MS:  warm without deformities, calf tenderness, cyanosis or clubbing  SKIN: warm and dry without lesions    NEURO:  alert, approp, no deficits   05/07/14 cxr nl      Assessment & Plan:

## 2014-05-18 NOTE — Patient Instructions (Addendum)
The key to effective treatment for your cough is eliminating the non-stop cycle of cough you're stuck in long enough to let your airway heal completely and then see if there is anything still making you cough once you stop the cough suppression, but this should take no more than 5 days to figure out  First take delsym two tsp every 12 hours and supplement if needed with hydrocodone up to 2 every 4 hours to suppress the urge to cough at all or even clear your throat. Swallowing water or using ice chips/non mint and menthol containing candies (such as lifesavers or sugarless jolly ranchers) are also effective.  You should rest your voice and avoid activities that you know make you cough.  Once you have eliminated the cough for 3 straight days try reducing the hydrocodone  first,  then the delsym as tolerated.    Prednisone 10 mg take  4 each am x 2 days,   2 each am x 2 days,  1 each am x 2 days and stop (this is to eliminate allergies and inflammation from coughing)  Protonix (pantoprazole) Take 30-60 min before first meal of the day and Pepcid 20 mg one bedtime plus chlorpheniramine 4 mg x 2 at bedtime (both available over the counter)  until cough is completely gone for at least a week without the need for cough suppression  GERD (REFLUX)  is an extremely common cause of respiratory symptoms, many times with no significant heartburn at all.    It can be treated with medication, but also with lifestyle changes including avoidance of late meals, excessive alcohol, smoking cessation, and avoid fatty foods, chocolate, peppermint, colas, red wine, and acidic juices such as orange juice.  NO MINT OR MENTHOL PRODUCTS SO NO COUGH DROPS  USE HARD CANDY INSTEAD (jolley ranchers or Stover's or Lifesavers (all available in sugarless versions) NO OIL BASED VITAMINS - use powdered substitutes.  If not 100% better in 2 weeks please return

## 2014-05-19 ENCOUNTER — Telehealth: Payer: Self-pay | Admitting: Internal Medicine

## 2014-05-19 MED ORDER — HYDROCODONE-ACETAMINOPHEN 10-325 MG PO TABS: 1.0000 | ORAL_TABLET | Freq: Four times a day (QID) | ORAL | Status: DC | PRN

## 2014-05-19 NOTE — Telephone Encounter (Signed)
Patient Instructions     The key to effective treatment for your cough is eliminating the non-stop cycle of cough you're stuck in long enough to let your airway heal completely and then see if there is anything still making you cough once you stop the cough suppression, but this should take no more than 5 days to figure out  First take delsym two tsp every 12 hours and supplement if needed with hydrocodone up to 2 every 4 hours to suppress the urge to cough at all or even clear your throat. Swallowing water or using ice chips/non mint and menthol containing candies (such as lifesavers or sugarless jolly ranchers) are also effective. You should rest your voice and avoid activities that you know make you cough.  Once you have eliminated the cough for 3 straight days try reducing the hydrocodone first, then the delsym as tolerated.  Prednisone 10 mg take 4 each am x 2 days, 2 each am x 2 days, 1 each am x 2 days and stop (this is to eliminate allergies and inflammation from coughing)  Protonix (pantoprazole) Take 30-60 min before first meal of the day and Pepcid 20 mg one bedtime plus chlorpheniramine 4 mg x 2 at bedtime (both available over the counter) until cough is completely gone for at least a week without the need for cough suppression  GERD (REFLUX) is an extremely common cause of respiratory symptoms, many times with no significant heartburn at all.  It can be treated with medication, but also with lifestyle changes including avoidance of late meals, excessive alcohol, smoking cessation, and avoid fatty foods, chocolate, peppermint, colas, red wine, and acidic juices such as orange juice.  NO MINT OR MENTHOL PRODUCTS SO NO COUGH DROPS  USE HARD CANDY INSTEAD (jolley ranchers or Stover's or Lifesavers (all available in sugarless versions)  NO OIL BASED VITAMINS - use powdered substitutes.  If not 100% better in 2 weeks please return    MW, Please advise on Rx. Thanks.

## 2014-05-19 NOTE — Telephone Encounter (Signed)
She told me she had it left over from neck surgery but if not ok to give her th 5 mg strength x 40

## 2014-05-19 NOTE — Telephone Encounter (Signed)
i called and spoke with pt and she stated that she had this before.  She stated that 1 tablet of this medication will not help her.  She stated that she has been only taking 1 tablet but this only takes the edge off but does not put her to sleep and if she does go to sleep she only sleeps for a couple of hours and she is back up.  Please advise on the sig of the rx for the hydrocodon 5/325  #40.

## 2014-05-19 NOTE — Assessment & Plan Note (Signed)

## 2014-05-19 NOTE — Telephone Encounter (Signed)
i have called and lmom to make the pt aware that the rx will be done.  We will leave this up front and she can come by and pick this up.

## 2014-05-19 NOTE — Telephone Encounter (Signed)
That's fine, give her the 10 mg and the purpose is not to put her to sleep, it's to control the cough   #40

## 2014-06-01 ENCOUNTER — Ambulatory Visit (INDEPENDENT_AMBULATORY_CARE_PROVIDER_SITE_OTHER): Payer: Managed Care, Other (non HMO) | Admitting: Internal Medicine

## 2014-06-01 ENCOUNTER — Encounter: Payer: Self-pay | Admitting: Internal Medicine

## 2014-06-01 VITALS — BP 140/82 | HR 86 | Temp 99.2°F | Ht 66.5 in | Wt 174.0 lb

## 2014-06-01 DIAGNOSIS — R059 Cough, unspecified: Secondary | ICD-10-CM

## 2014-06-01 DIAGNOSIS — R05 Cough: Secondary | ICD-10-CM

## 2014-06-01 DIAGNOSIS — R058 Other specified cough: Secondary | ICD-10-CM

## 2014-06-01 MED ORDER — PREDNISONE 10 MG PO TABS: ORAL_TABLET | ORAL | Status: DC

## 2014-06-01 NOTE — Patient Instructions (Addendum)
The key to effective treatment for your cough is eliminating the non-stop cycle of cough you're stuck in long enough to let your airway heal completely and then see if there is anything still making you cough once you stop the cough suppression, but this should take no more than 5 days to figure out  Resume singulair 10 mg each evening until return   For drainage take chlortrimeton (chlorpheniramine) 4 mg every 4 hours available over the counter (may cause drowsiness)  First take delsym two tsp every 12 hours and supplement if needed with hydrocodone up to 2 every 4 hours to suppress the urge to cough at all or even clear your throat. Swallowing water or using ice chips/non mint and menthol containing candies (such as lifesavers or sugarless jolly ranchers) are also effective.  You should rest your voice and avoid activities that you know make you cough.  Once you have eliminated the cough for 3 straight days try reducing the hydrocodone  first,  then the delsym as tolerated.    Prednisone 10 mg take  4 each am x 2 days,   2 each am x 2 days,  1 each am x 2 days and stop (this is to eliminate allergies and inflammation from coughing)  Protonix (pantoprazole) Take 30-60 min before first meal of the day and Pepcid 20 mg one bedtime plus chlorpheniramine 4 mg x 2 at bedtime (both available over the counter)  until cough is completely gone for at least a week without the need for cough suppression  GERD (REFLUX)  is an extremely common cause of respiratory symptoms, many times with no significant heartburn at all.    It can be treated with medication, but also with lifestyle changes including avoidance of late meals, excessive alcohol, smoking cessation, and avoid fatty foods, chocolate, peppermint, colas, red wine, and acidic juices such as orange juice.  NO MINT OR MENTHOL PRODUCTS SO NO COUGH DROPS  USE HARD CANDY INSTEAD (jolley ranchers or Stover's or Lifesavers (all available in sugarless  versions) NO OIL BASED VITAMINS - use powdered substitutes.  Please see patient coordinator before you leave today  to schedule sinus CT   Please schedule a follow up office visit in 3 weeks, sooner if needed

## 2014-06-01 NOTE — Progress Notes (Signed)
Subjective:    Patient ID: Abigail Love, female    DOB: [birth year],   MRN: [id number]   Brief patient profile:  67 yowf never smoker never problems as child but starting in late 1990's recurrent "bronchitis" requiring zpak typically with onset of cold weather that resolved p around 2006 then cough sarted p dog moved in around 2009 not resoponsive to otcs or gerd rx with allergy tests by [facility] pos mold only rx (same house since 1992)   History of Present Illness  05/18/2014 1st [facility] office visit/ Abigail Love /  Chief Complaint  Patient presents with  . PULMONARY CONSULT    Referred by Dr Jonny Ruiz for Chronic Cough x 5 year, progressively worse.   cough is 24/7, mostly non productive. Really not short of breath unless coughing. Chest tightness with exp to mold ? Better  qvar singulair no better saba helps tightness but not cough Almost vomit Tried acid suppression rec  First take delsym two tsp every 12 hours and supplement if needed with hydrocodone up to 2 every 4 hours to suppress the urge to cough at all or even clear your throat.    Prednisone 10 mg take  4 each am x 2 days,   2 each am x 2 days,  1 each am x 2 days and stop (this is to eliminate allergies and inflammation from coughing) Protonix (pantoprazole) Take 30-60 min before first meal of the day and Pepcid 20 mg one bedtime plus chlorpheniramine 4 mg x 2 at bedtime (both available over the counter)  until cough is completely gone for at least a week without the need for cough suppression GERD diet    Kouffman Reflux v Neurogenic Cough Differentiator Reflux Comments  Do you awaken from a sound sleep coughing violently?                            With trouble breathing? occ   Do you have choking episodes when you cannot  Get enough air, gasping for air ?              Yes   Do you usually cough when you lie down into  The bed, or when you just lie down to rest ?                          sometimes   Do you  usually cough after meals or eating?         no   Do you cough when (or after) you bend over?    Yes   GERD SCORE     Kouffman Reflux v Neurogenic Cough Differentiator Neurogenic   Do you more-or-less cough all day long? yes   Does change of temperature make you cough? no   Does laughing or chuckling cause you to cough? yes   Do fumes (perfume, automobile fumes, burned  Toast, etc.,) cause you to cough ?      Not aware   Does speaking, singing, or talking on the phone cause you to cough   ?               sometimes   Neurogenic/Airway score       06/01/2014 f/u ov/Abigail Love re: cough x 6 y Chief Complaint  Patient presents with  . Follow-up    Pt states cough has improved some. Cough is prod with minimal sputum- ? color.  was 90% better while on intense rx but not able to do it for 3 days straight  No obvious day to day or daytime variabilty or assocsob or cp or chest tightness, subjective wheeze overt sinus or hb symptoms. No unusual exp hx or h/o childhood pna/ asthma or knowledge of premature birth.  Sleeping ok without nocturnal  or early am exacerbation  of respiratory  c/o's or need for noct saba. Also denies any obvious fluctuation of symptoms with weather or environmental changes or other aggravating or alleviating factors except as outlined above   Current Medications, Allergies, Complete Past Medical History, Past Surgical History, Family History, and Social History were reviewed in [employer] record.  ROS  The following are not active complaints unless bolded sore throat, dysphagia, dental problems, itching, sneezing,  nasal congestion or excess/ purulent secretions, ear ache,   fever, chills, sweats, unintended wt loss, pleuritic or exertional cp, hemoptysis,  orthopnea pnd or leg swelling, presyncope, palpitations, heartburn, abdominal pain, anorexia, nausea, vomiting, diarrhea  or change in bowel or urinary habits, change in stools or urine,  dysuria,hematuria,  rash, arthralgias, visual complaints, headache, numbness weakness or ataxia or problems with walking or coordination,  change in mood/affect or memory.                      Objective:   Physical Exam   amb wf nad  06/01/2014         174  Wt Readings from Last 3 Encounters:  05/18/14 174 lb 6.4 oz (79.107 kg)       HEENT: nl dentition, turbinates, and orophanx. Nl external ear canals without cough reflex   NECK :  without JVD/Nodes/TM/ nl carotid upstrokes bilaterally   LUNGS: no acc muscle use, clear to A and P bilaterally without cough on insp or exp maneuvers   CV:  RRR  no s3 or murmur or increase in P2, no edema   ABD:  soft and nontender with nl excursion in the supine position. No bruits or organomegaly, bowel sounds nl  MS:  warm without deformities, calf tenderness, cyanosis or clubbing     05/07/14 cxr nl      Assessment & Plan:   Outpatient Encounter Prescriptions as of 06/01/2014  Medication Sig  . albuterol (PROVENTIL HFA;VENTOLIN HFA) 108 (90 BASE) MCG/ACT inhaler Inhale 1-2 puffs into the lungs as needed for wheezing or shortness of breath.  . famotidine (PEPCID) 20 MG tablet One at bedtime  . GILDESS FE 1/20 1-20 MG-MCG tablet Take 1 tablet by mouth daily.   Marland Kitchen ibuprofen (ADVIL,MOTRIN) 800 MG tablet Take 1 tablet (800 mg total) by mouth every 8 (eight) hours as needed for mild pain.  . pantoprazole (PROTONIX) 40 MG tablet Take 1 tablet (40 mg total) by mouth daily. Take 30-60 min before first meal of the day  . HYDROcodone-acetaminophen (NORCO) 10-325 MG per tablet Take 1 tablet by mouth every 6 (six) hours as needed.  . predniSONE (DELTASONE) 10 MG tablet Take  4 each am x 2 days,   2 each am x 2 days,  1 each am x 2 days and stop  . [DISCONTINUED] predniSONE (DELTASONE) 10 MG tablet Take  4 each am x 2 days,   2 each am x 2 days,  1 each am x 2 days and stop

## 2014-06-02 NOTE — Assessment & Plan Note (Signed)
I had an extended discussion with the patient today lasting 15 to 20 minutes of a 25 minute visit on the following issues: The standardized cough guidelines published in Chest by Lissa Morales in 2006 are still the best available and consist of a multiple step process (up to 12!) , not a single office visit,  and are intended  to address this problem logically,  with an alogrithm dependent on response to empiric treatment at  each progressive step  to determine a specific diagnosis with  minimal addtional testing needed. Therefore if adherence is an issue or can't be accurately verified,  it's very unlikely the standard evaluation and treatment will be successful here.    Furthermore, response to therapy (other than acute cough suppression, which should only be used short term with avoidance of narcotic containing cough syrups if possible), can be a gradual process for which the patient may perceive immediate benefit.  For now will complete w/u with sinus ct and repeat cyclical cough regimen and if neg resp go to MCT next and then neurontin trial   Concerned about BCP's causing non - acid GERD, not clear benefit > risk at age 53 and asked her to discuss with Dr Matthew Saras  Unlike going to an eye doctor where the best perscription is almost always the first one and is immediately effective, this is almost never the case in the management of chronic cough syndromes. Therefore the patient needs to commit up front to consistently adhere to recommendations  for up to 6 weeks of therapy directed at the likely underlying problem(s) before the response can be reasonably evaluated.

## 2014-06-04 ENCOUNTER — Encounter: Payer: Self-pay | Admitting: Internal Medicine

## 2014-06-04 ENCOUNTER — Ambulatory Visit (INDEPENDENT_AMBULATORY_CARE_PROVIDER_SITE_OTHER)
Admission: RE | Admit: 2014-06-04 | Discharge: 2014-06-04 | Disposition: A | Discharge status: Home / Self Care | Payer: Managed Care, Other (non HMO) | Source: Ambulatory Visit | Attending: Internal Medicine | Admitting: Internal Medicine

## 2014-06-04 DIAGNOSIS — R059 Cough, unspecified: Secondary | ICD-10-CM

## 2014-06-04 DIAGNOSIS — R05 Cough: Secondary | ICD-10-CM

## 2014-06-04 DIAGNOSIS — R058 Other specified cough: Secondary | ICD-10-CM

## 2014-06-04 IMAGING — CT CT PARANASAL SINUSES LIMITED
1 of 2 series · 13 of 16 positions shown, 17 images · non-contrast
Comparison: Cervical spine MRI [DATE].

CLINICAL DATA: 53-year-old female with chronic cough. Upper airway
syndrome. Initial encounter.

EXAM:
CT PARANASAL SINUS WITHOUT CONTRAST
TECHNIQUE: Multidetector CT images of the paranasal sinuses were obtained using
the standard protocol without intravenous contrast.

[Series 4: ltd sinus 3.0 h30s · axial · 0.34mm/px · z∈[-72,+4]mm · 13 of 15 slices shown, 17 images]
[im 2/15  brain]
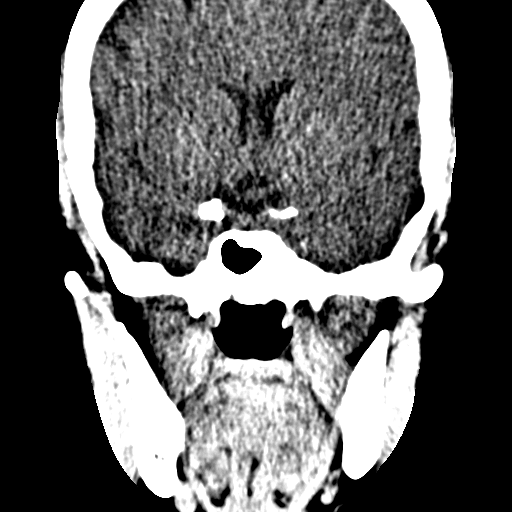
[im 2/15  bone]
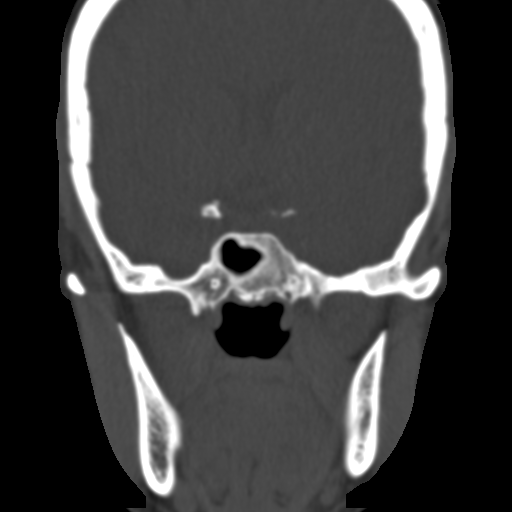
[im 3/15  bone]
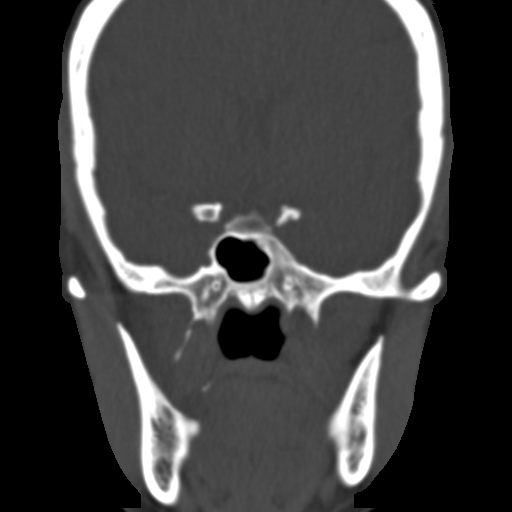
[im 4/15  bone]
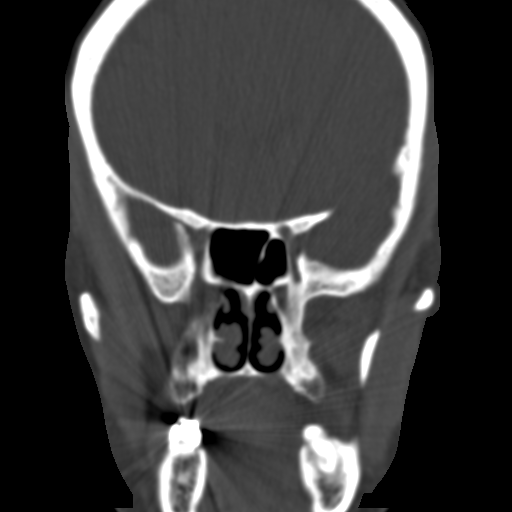
[im 5/15  bone]
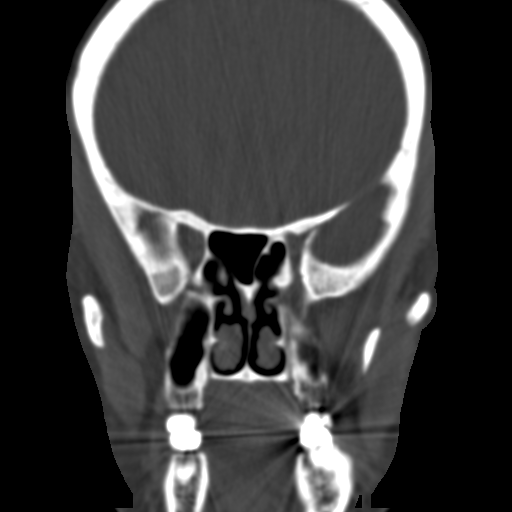
[im 6/15  brain]
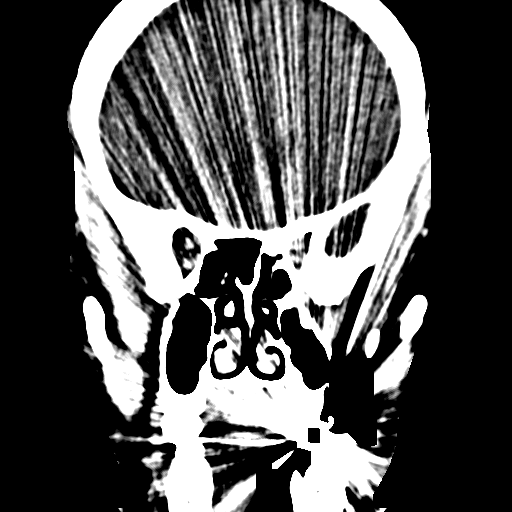
[im 6/15  bone]
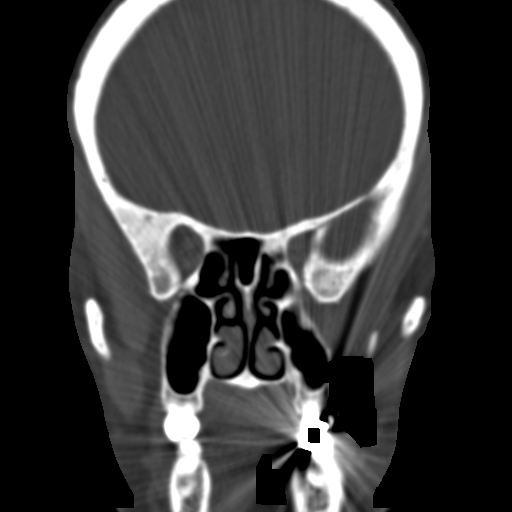
[im 7/15  bone]
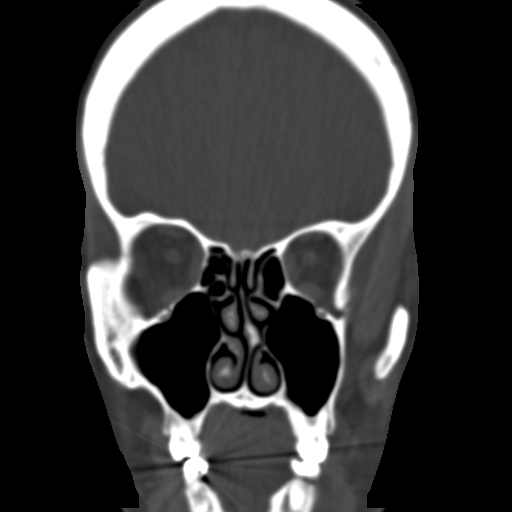
[im 8/15  bone]
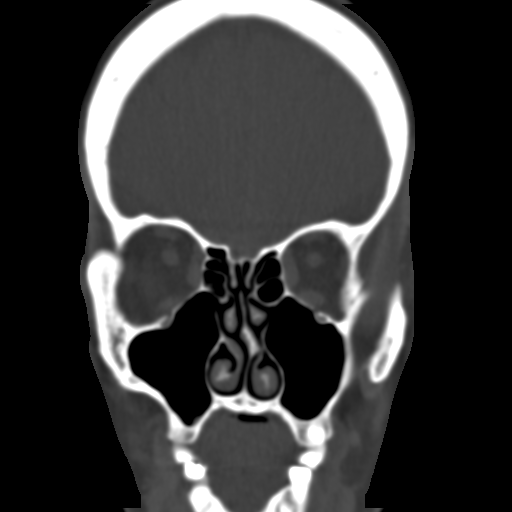
[im 9/15  bone]
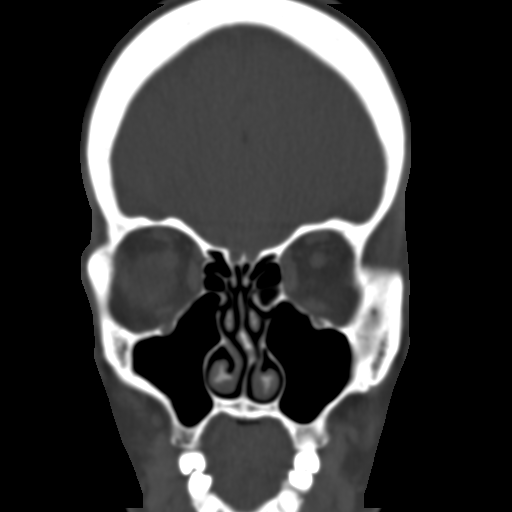
[im 10/15  brain]
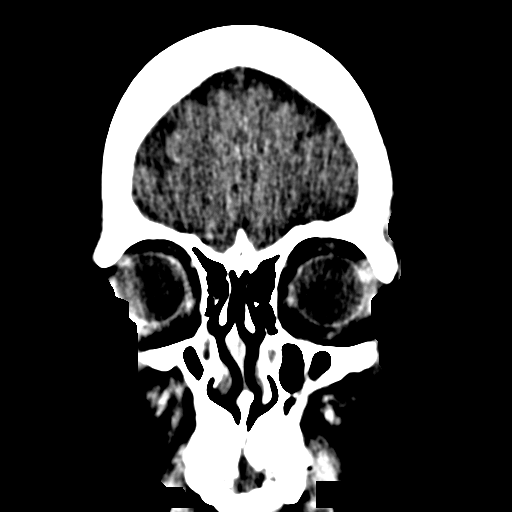
[im 10/15  bone]
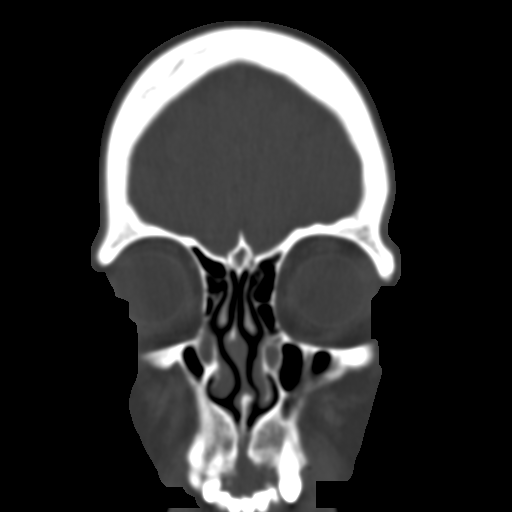
[im 11/15  bone]
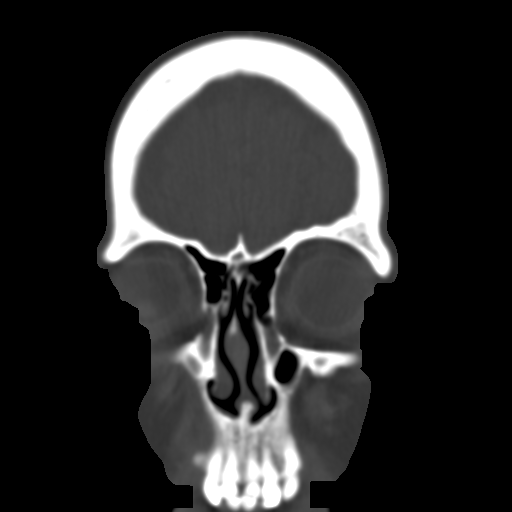
[im 12/15  bone]
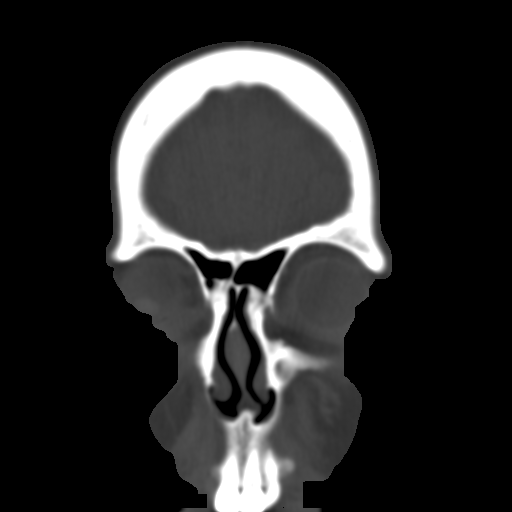
[im 13/15  bone]
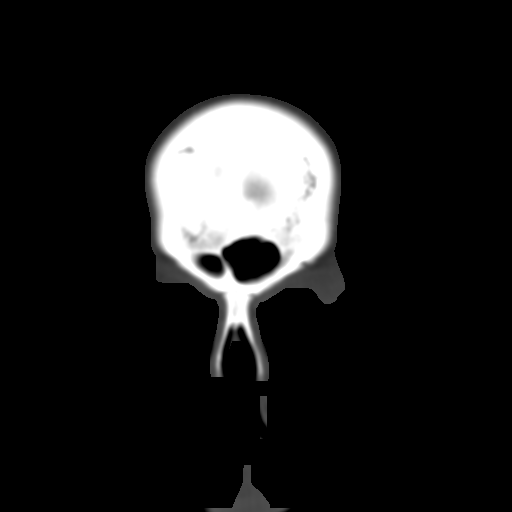
[im 14/15  brain]
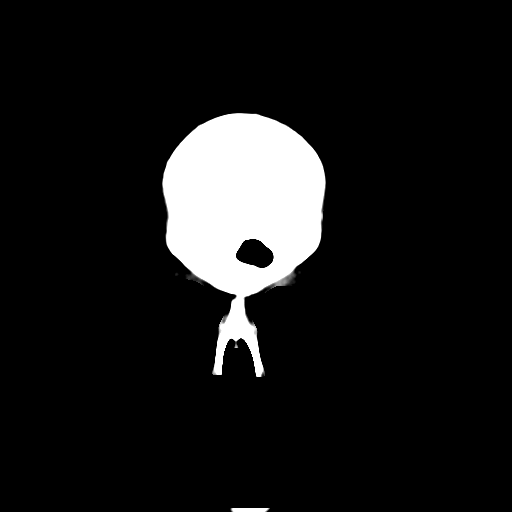
[im 14/15  bone]
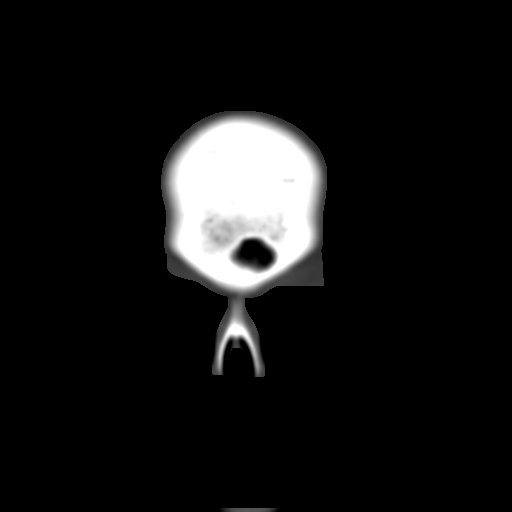

[13 of 16 positions shown; findings below may reference images not displayed]

FINDINGS: Grossly negative visible non contrast brain parenchyma. Grossly
negative visible orbit and face soft tissues.

Negative visualized sphenoid, ethmoid, frontal, and maxillary
sinuses (minimal mucosal thickening right maxillary alveolar recess
series 5, image 8). Rightward anterior but leftward posterior nasal
septal deviation. Negative nasal cavity otherwise. No acute osseous
abnormality identified.
IMPRESSION: Normal paranasal sinuses.

## 2014-06-04 NOTE — Progress Notes (Signed)
Quick Note:  Spoke with pt and notified of results per Dr. Wert. Pt verbalized understanding and denied any questions.  ______ 

## 2014-06-23 ENCOUNTER — Encounter: Payer: Self-pay | Admitting: Internal Medicine

## 2014-06-23 ENCOUNTER — Ambulatory Visit (INDEPENDENT_AMBULATORY_CARE_PROVIDER_SITE_OTHER): Payer: Managed Care, Other (non HMO) | Admitting: Internal Medicine

## 2014-06-23 VITALS — BP 140/90 | HR 99 | Temp 98.6°F | Ht 67.0 in | Wt 172.0 lb

## 2014-06-23 DIAGNOSIS — Z23 Encounter for immunization: Secondary | ICD-10-CM

## 2014-06-23 DIAGNOSIS — R058 Other specified cough: Secondary | ICD-10-CM

## 2014-06-23 DIAGNOSIS — R05 Cough: Secondary | ICD-10-CM

## 2014-06-23 DIAGNOSIS — R059 Cough, unspecified: Secondary | ICD-10-CM

## 2014-06-23 MED ORDER — GABAPENTIN 100 MG PO CAPS
100.0000 mg | ORAL_CAPSULE | Freq: Three times a day (TID) | ORAL | Status: DC
Start: 2014-06-23 — End: 2014-08-03

## 2014-06-23 NOTE — Progress Notes (Signed)
Subjective:    Patient ID: Abigail Love, female    DOB: [birth year],   MRN: [id number]   Brief patient profile:  31 yowf never smoker never problems as child but starting in late 1990's recurrent "bronchitis" requiring zpak typically with onset of cold weather that resolved p around 2006 then cough sarted p dog moved in around 2009 not responsive to otcs or gerd rx with allergy tests by [facility] pos mold only rx (same house since 1992)   History of Present Illness  05/18/2014 1st [facility] office visit/ Abigail Love / on [facility] Chief Complaint  Patient presents with  . PULMONARY CONSULT    Referred by Dr Abigail Love for Chronic Cough x 5 year, progressively worse.   cough is 24/7, mostly non productive. Really not short of breath unless coughing. Chest tightness with exp to mold ? Better  qvar singulair no better saba helps tightness but not cough Almost vomit Tried acid suppression rec  First take delsym two tsp every 12 hours and supplement if needed with hydrocodone up to 2 every 4 hours to suppress the urge to cough at all or even clear your throat.    Prednisone 10 mg take  4 each am x 2 days,   2 each am x 2 days,  1 each am x 2 days and stop (this is to eliminate allergies and inflammation from coughing) Protonix (pantoprazole) Take 30-60 min before first meal of the day and Pepcid 20 mg one bedtime plus chlorpheniramine 4 mg x 2 at bedtime (both available over the counter)  until cough is completely gone for at least a week without the need for cough suppression GERD diet    06/01/2014 f/u ov/Abigail Love re: cough x 6 y Chief Complaint  Patient presents with  . Follow-up    Pt states cough has improved some. Cough is prod with minimal sputum- ? color.   was 90% better while on intense rx but not able to do it for 3 days straight rec Elimination rx > 100% effective x 3 days  06/23/2014 ov / Abigail Love re daily Cough x 6 y Chief Complaint  Patient presents with  . Follow-up    Pt states  that her cough is no better, no worse since last visit.     Kouffman Reflux v Neurogenic Cough Differentiator Reflux Comments  Do you awaken from a sound sleep coughing violently?                            With trouble breathing? Not now    Do you have choking episodes when you cannot  Get enough air, gasping for air ?              Less but occ   Do you usually cough when you lie down into  The bed, or when you just lie down to rest ?                           less now   Do you usually cough after meals or eating?         no   Do you cough when (or after) you bend over?    no   GERD SCORE     Kouffman Reflux v Neurogenic Cough Differentiator Neurogenic   Do you more-or-less cough all day long? yes   Does change of temperature make you cough? no  Does laughing or chuckling cause you to cough? yes   Do fumes (perfume, automobile fumes, burned  Toast, etc.,) cause you to cough ?      No    Does speaking, singing, or talking on the phone cause you to cough   ?               sometimes   Neurogenic/Airway score       No other obvious day to day or daytime variabilty or assocsob or cp or chest tightness, subjective wheeze overt sinus or hb symptoms. No unusual exp hx or h/o childhood pna/ asthma or knowledge of premature birth.  Sleeping ok without nocturnal  or early am exacerbation  of respiratory  c/o's or need for noct saba. Also denies any obvious fluctuation of symptoms with weather or environmental changes or other aggravating or alleviating factors except as outlined above   Current Medications, Allergies, Complete Past Medical History, Past Surgical History, Family History, and Social History were reviewed in [employer] record.  ROS  The following are not active complaints unless bolded sore throat, dysphagia, dental problems, itching, sneezing,  nasal congestion at hs or excess/ purulent secretions, ear ache,   fever, chills, sweats, unintended wt loss,  pleuritic or exertional cp, hemoptysis,  orthopnea pnd or leg swelling, presyncope, palpitations, heartburn, abdominal pain, anorexia, nausea, vomiting, diarrhea  or change in bowel or urinary habits, change in stools or urine, dysuria,hematuria,  rash, arthralgias, visual complaints, headache, numbness weakness or ataxia or problems with walking or coordination,  change in mood/affect or memory.                      Objective:   Physical Exam   amb wf nad with freq throat clearing   06/01/2014         174 > 06/23/2014  172  Wt Readings from Last 3 Encounters:  05/18/14 174 lb 6.4 oz (79.107 kg)       HEENT: nl dentition, turbinates, and orophanx. Nl external ear canals without cough reflex   NECK :  without JVD/Nodes/TM/ nl carotid upstrokes bilaterally   LUNGS: no acc muscle use, clear to A and P bilaterally without cough on insp or exp maneuvers   CV:  RRR  no s3 or murmur or increase in P2, no edema   ABD:  soft and nontender with nl excursion in the supine position. No bruits or organomegaly, bowel sounds nl  MS:  warm without deformities, calf tenderness, cyanosis or clubbing     05/07/14 cxr nl      Assessment & Plan:

## 2014-06-23 NOTE — Patient Instructions (Addendum)
Neurontin 100 mg three times daily - if not improving start back on protonix and pepcid and call [facility] [phone]  For methacholine challenge for asthma while on acid suppression for 2 weeks   Please schedule a follow up office visit in 4 weeks, sooner if needed

## 2014-06-23 NOTE — Assessment & Plan Note (Signed)
-   cyclical cough regimen 7/89/3810 > repeat over labor day weekend planned 2015  - Sinus CT  06/04/2014 > Normal paranasal sinuses    She eliminated cyclical cough and as soon as stopped the codeine immediately had the urge to clear her throat day > noct typical of irritable larynx syndrome >   For now neurontin trial indicated but also consider ENT eval/ methacholine challenge on gerd rx to be complete

## 2014-07-21 ENCOUNTER — Ambulatory Visit: Payer: Managed Care, Other (non HMO) | Admitting: Internal Medicine

## 2014-07-27 ENCOUNTER — Ambulatory Visit: Payer: Managed Care, Other (non HMO) | Admitting: Internal Medicine

## 2014-08-03 ENCOUNTER — Ambulatory Visit (INDEPENDENT_AMBULATORY_CARE_PROVIDER_SITE_OTHER): Payer: Managed Care, Other (non HMO) | Admitting: Internal Medicine

## 2014-08-03 ENCOUNTER — Encounter: Payer: Self-pay | Admitting: Internal Medicine

## 2014-08-03 DIAGNOSIS — R058 Other specified cough: Secondary | ICD-10-CM

## 2014-08-03 DIAGNOSIS — R05 Cough: Secondary | ICD-10-CM

## 2014-08-03 MED ORDER — GABAPENTIN 300 MG PO CAPS: 300.0000 mg | ORAL_CAPSULE | Freq: Three times a day (TID) | ORAL | Status: DC

## 2014-08-03 MED ORDER — FAMOTIDINE 20 MG PO TABS: ORAL_TABLET | ORAL | Status: DC

## 2014-08-03 MED ORDER — PANTOPRAZOLE SODIUM 40 MG PO TBEC: 40.0000 mg | DELAYED_RELEASE_TABLET | Freq: Every day | ORAL | Status: DC

## 2014-08-03 NOTE — Progress Notes (Signed)
Subjective:    Patient ID: Abigail Love, female    DOB: 21-Mar-1961,   MRN: [id number]   Brief patient profile:  72 yowf never smoker never problems as child but starting in late 1990's recurrent "bronchitis" requiring zpak typically with onset of cold weather that resolved p around 2006 then cough sarted p dog moved in around 2009 not responsive to otcs or gerd rx with allergy tests by [facility] pos mold only rx (same house since 1992)   History of Present Illness  05/18/2014 1st [facility] office visit/ Abigail Love / on [facility] Chief Complaint  Patient presents with  . PULMONARY CONSULT    Referred by Dr Jonny Ruiz for Chronic Cough x 5 year, progressively worse.   cough is 24/7, mostly non productive. Really not short of breath unless coughing. Chest tightness with exp to mold ? Better  qvar singulair no better saba helps tightness but not cough Almost vomit Tried acid suppression rec  First take delsym two tsp every 12 hours and supplement if needed with hydrocodone up to 2 every 4 hours to suppress the urge to cough at all or even clear your throat.    Prednisone 10 mg take  4 each am x 2 days,   2 each am x 2 days,  1 each am x 2 days and stop (this is to eliminate allergies and inflammation from coughing) Protonix (pantoprazole) Take 30-60 min before first meal of the day and Pepcid 20 mg one bedtime plus chlorpheniramine 4 mg x 2 at bedtime (both available over the counter)  until cough is completely gone for at least a week without the need for cough suppression GERD diet    06/01/2014 f/u ov/Abigail Love re: cough x 6 y Chief Complaint  Patient presents with  . Follow-up    Pt states cough has improved some. Cough is prod with minimal sputum- ? color.   was 90% better while on intense rx but not able to do it for 3 days straight rec Elimination rx > 100% effective x 3 days  06/23/2014 ov / Abigail Love re daily Cough x 6 y Chief Complaint  Patient presents with  . Follow-up    Pt states  that her cough is no better, no worse since last visit.     Kouffman Reflux v Neurogenic Cough Differentiator Reflux Comments  Do you awaken from a sound sleep coughing violently?                            With trouble breathing? Not now    Do you have choking episodes when you cannot  Get enough air, gasping for air ?              Less but occ   Do you usually cough when you lie down into  The bed, or when you just lie down to rest ?                           less now   Do you usually cough after meals or eating?         no   Do you cough when (or after) you bend over?    no   GERD SCORE     Kouffman Reflux v Neurogenic Cough Differentiator Neurogenic   Do you more-or-less cough all day long? yes   Does change of temperature make you cough? no  Does laughing or chuckling cause you to cough? yes   Do fumes (perfume, automobile fumes, burned  Toast, etc.,) cause you to cough ?      No    Does speaking, singing, or talking on the phone cause you to cough   ?               sometimes   Neurogenic/Airway score        08/03/2014 f/u ov/Abigail Love re: cough x 6 y  Chief Complaint  Patient presents with  . Follow-up    Pt states that her cough is unchanged since the last visit.  No new co's today.    No longer coughing hs, otherwise all the answers to cough questionaire are the same/ did not take gerd rx as rec, still on bcps, did not discuss my concerns with Dr Matthew Saras.   No other obvious day to day or daytime variabilty or assoc sob or cp or chest tightness, subjective wheeze overt sinus or hb symptoms. No unusual exp hx or h/o childhood pna/ asthma or knowledge of premature birth.  Sleeping ok without nocturnal  or early am exacerbation  of respiratory  c/o's or need for noct saba. Also denies any obvious fluctuation of symptoms with weather or environmental changes or other aggravating or alleviating factors except as outlined above   Current Medications, Allergies, Complete Past Medical  History, Past Surgical History, Family History, and Social History were reviewed in [employer] record.  ROS  The following are not active complaints unless bolded sore throat, dysphagia, dental problems, itching, sneezing,  nasal congestion at hs or excess/ purulent secretions, ear ache,   fever, chills, sweats, unintended wt loss, pleuritic or exertional cp, hemoptysis,  orthopnea pnd or leg swelling, presyncope, palpitations, heartburn, abdominal pain, anorexia, nausea, vomiting, diarrhea  or change in bowel or urinary habits, change in stools or urine, dysuria,hematuria,  rash, arthralgias, visual complaints, headache, numbness weakness or ataxia or problems with walking or coordination,  change in mood/affect or memory.                      Objective:   Physical Exam   amb wf nad with freq throat clearing   06/01/2014         174 > 06/23/2014  172 > 08/03/2014  179  Wt Readings from Last 3 Encounters:  05/18/14 174 lb 6.4 oz (79.107 kg)       HEENT: nl dentition, turbinates, and orophanx. Nl external ear canals without cough reflex   NECK :  without JVD/Nodes/TM/ nl carotid upstrokes bilaterally   LUNGS: no acc muscle use, clear to A and P bilaterally without cough on insp or exp maneuvers   CV:  RRR  no s3 or murmur or increase in P2, no edema   ABD:  soft and nontender with nl excursion in the supine position. No bruits or organomegaly, bowel sounds nl  MS:  warm without deformities, calf tenderness, cyanosis or clubbing     05/07/14 cxr nl      Assessment & Plan:

## 2014-08-03 NOTE — Patient Instructions (Addendum)
Neurontin 300 mg three times daily.  Pantoprazole (protonix) 40 mg  Take 30-60 min before first meal of the day and Pepcid 20 mg one bedtime until return to office - this is the best way to tell whether stomach acid is contributing to your problem.    Please see patient coordinator before you leave today  to schedule   For methacholine challenge for asthma while on acid suppression for 2 weeks.

## 2014-08-06 NOTE — Assessment & Plan Note (Signed)
-   cyclical cough regimen 7/65/4650 > repeat over labor day weekend planned 2015  - Sinus CT  06/04/2014 > Normal paranasal sinuses - Neurontin rx 06/23/2014 > no better on 100 tid   Still on bcps ,  Did not take gerd rx as rec nor schedule MCT but needs to be on gerd rx x 2 weeks prior to MCT to reduce false pos risk  Discussed in detail all the  indications, usual  risks and alternatives  relative to the benefits with patient who agrees to proceed with MCT then referral to voice center at [facility] would be next step

## 2014-08-20 ENCOUNTER — Ambulatory Visit (HOSPITAL_COMMUNITY)
Admission: RE | Admit: 2014-08-20 | Discharge: 2014-08-20 | Disposition: A | Discharge status: Home / Self Care | Payer: Managed Care, Other (non HMO) | Source: Ambulatory Visit | Attending: Internal Medicine | Admitting: Internal Medicine

## 2014-08-20 DIAGNOSIS — R05 Cough: Secondary | ICD-10-CM | POA: Insufficient documentation

## 2014-08-20 DIAGNOSIS — R058 Other specified cough: Secondary | ICD-10-CM

## 2014-08-20 LAB — PULMONARY FUNCTION TEST
FEF 25-75 PRE: 2.22 L/s
FEF 25-75 Post: 1.82 L/sec
FEF2575-%CHANGE-POST: -18 %
FEF2575-%Pred-Post: 64 %
FEF2575-%Pred-Pre: 78 %
FEV1-%Change-Post: -8 %
FEV1-%PRED-PRE: 87 %
FEV1-%Pred-Post: 79 %
FEV1-PRE: 2.6 L
FEV1-Post: 2.37 L
FEV1FVC-%Change-Post: 0 %
FEV1FVC-%Pred-Pre: 92 %
FEV6-%CHANGE-POST: -9 %
FEV6-%PRED-POST: 86 %
FEV6-%Pred-Pre: 95 %
FEV6-PRE: 3.48 L
FEV6-Post: 3.17 L
FEV6FVC-%CHANGE-POST: 0 %
FEV6FVC-%Pred-Post: 103 %
FEV6FVC-%Pred-Pre: 102 %
FVC-%Change-Post: -9 %
FVC-%PRED-POST: 84 %
FVC-%Pred-Pre: 92 %
FVC-POST: 3.17 L
FVC-Pre: 3.49 L
POST FEV6/FVC RATIO: 100 %
Post FEV1/FVC ratio: 75 %
Pre FEV1/FVC ratio: 74 %
Pre FEV6/FVC Ratio: 100 %

## 2014-08-20 MED ORDER — ALBUTEROL SULFATE (2.5 MG/3ML) 0.083% IN NEBU
2.5000 mg | INHALATION_SOLUTION | Freq: Once | RESPIRATORY_TRACT | Status: AC
Start: 2014-08-20 — End: 2014-08-20
  Administered 2014-08-20: 2.5 mg via RESPIRATORY_TRACT

## 2014-08-20 MED ORDER — METHACHOLINE 0.25 MG/ML NEB SOLN
2.0000 mL | Freq: Once | RESPIRATORY_TRACT | Status: AC
  Administered 2014-08-20: 0.5 mg via RESPIRATORY_TRACT

## 2014-08-20 MED ORDER — METHACHOLINE 4 MG/ML NEB SOLN
2.0000 mL | Freq: Once | RESPIRATORY_TRACT | Status: AC
  Administered 2014-08-20: 8 mg via RESPIRATORY_TRACT

## 2014-08-20 MED ORDER — METHACHOLINE 0.0625 MG/ML NEB SOLN
2.0000 mL | Freq: Once | RESPIRATORY_TRACT | Status: AC
  Administered 2014-08-20: 0.125 mg via RESPIRATORY_TRACT

## 2014-08-20 MED ORDER — SODIUM CHLORIDE 0.9 % IN NEBU
3.0000 mL | INHALATION_SOLUTION | Freq: Once | RESPIRATORY_TRACT | Status: AC
  Administered 2014-08-20: 3 mL via RESPIRATORY_TRACT

## 2014-08-20 MED ORDER — METHACHOLINE 1 MG/ML NEB SOLN
2.0000 mL | Freq: Once | RESPIRATORY_TRACT | Status: AC
  Administered 2014-08-20: 2 mg via RESPIRATORY_TRACT

## 2014-08-20 MED ORDER — METHACHOLINE 16 MG/ML NEB SOLN
2.0000 mL | Freq: Once | RESPIRATORY_TRACT | Status: AC
  Administered 2014-08-20: 32 mg via RESPIRATORY_TRACT

## 2014-08-23 NOTE — Progress Notes (Signed)
Quick Note:  Spoke with pt and notified of results per Dr. Wert. Pt verbalized understanding and denied any questions.  ______ 

## 2014-09-01 ENCOUNTER — Ambulatory Visit (INDEPENDENT_AMBULATORY_CARE_PROVIDER_SITE_OTHER): Payer: Managed Care, Other (non HMO) | Admitting: Internal Medicine

## 2014-09-01 ENCOUNTER — Encounter: Payer: Self-pay | Admitting: Internal Medicine

## 2014-09-01 VITALS — BP 122/80 | HR 85 | Ht 66.5 in | Wt 182.0 lb

## 2014-09-01 DIAGNOSIS — R058 Other specified cough: Secondary | ICD-10-CM

## 2014-09-01 DIAGNOSIS — R05 Cough: Secondary | ICD-10-CM

## 2014-09-01 MED ORDER — GABAPENTIN 300 MG PO CAPS: 300.0000 mg | ORAL_CAPSULE | Freq: Three times a day (TID) | ORAL | Status: DC

## 2014-09-01 MED ORDER — AMITRIPTYLINE HCL 10 MG PO TABS: 10.0000 mg | ORAL_TABLET | Freq: Every day | ORAL | Status: DC

## 2014-09-01 MED ORDER — GABAPENTIN 300 MG PO CAPS: 300.0000 mg | ORAL_CAPSULE | Freq: Four times a day (QID) | ORAL | Status: DC

## 2014-09-01 NOTE — Progress Notes (Signed)
Subjective:    Patient ID: Abigail Love, female    DOB: 1961/06/25,   MRN: [id number]   Brief patient profile:  19 yowf never smoker never problems as child but starting in late 1990's recurrent "bronchitis" requiring zpak typically with onset of cold weather that resolved p around 2006 then cough sarted p dog moved in around 2009 not responsive to otcs or gerd rx with allergy tests by Abigail Love pos mold only rx (same house since 1992)   History of Present Illness  05/18/2014 1st [facility] office visit/ Abigail Love / on Abigail Love Chief Complaint  Patient presents with  . PULMONARY CONSULT    Referred by Abigail Abigail Love for Chronic Cough x 5 year, progressively worse.   cough is 24/7, mostly non productive. Really not short of breath unless coughing. Chest tightness with exp to mold ? Better  qvar singulair no better saba helps tightness but not cough Almost vomit Tried acid suppression rec  First take delsym two tsp every 12 hours and supplement if needed with hydrocodone up to 2 every 4 hours to suppress the urge to cough at all or even clear your throat.    Prednisone 10 mg take  4 each am x 2 days,   2 each am x 2 days,  1 each am x 2 days and stop (this is to eliminate allergies and inflammation from coughing) Protonix (pantoprazole) Take 30-60 min before first meal of the day and Pepcid 20 mg one bedtime plus chlorpheniramine 4 mg x 2 at bedtime (both available over the counter)  until cough is completely gone for at least a week without the need for cough suppression GERD diet    06/01/2014 f/u ov/Abigail Love re: cough x 6 y Chief Complaint  Patient presents with  . Follow-up    Pt states cough has improved some. Cough is prod with minimal sputum- ? color.   was 90% better while on intense rx but not able to do it for 3 days straight rec Elimination rx > 100% effective x 3 days  06/23/2014 ov / Abigail Love re daily Cough x 6 y Chief Complaint  Patient presents with  . Follow-up    Pt states  that her cough is no better, no worse since last visit.     Kouffman Reflux v Neurogenic Cough Differentiator Reflux Comments  Do you awaken from a sound sleep coughing violently?                            With trouble breathing? Not now    Do you have choking episodes when you cannot  Get enough air, gasping for air ?              Less but occ   Do you usually cough when you lie down into  The bed, or when you just lie down to rest ?                           less now   Do you usually cough after meals or eating?         no   Do you cough when (or after) you bend over?    no   GERD SCORE     Kouffman Reflux v Neurogenic Cough Differentiator Neurogenic   Do you more-or-less cough all day long? yes   Does change of temperature make you cough? no  Does laughing or chuckling cause you to cough? yes   Do fumes (perfume, automobile fumes, burned  Toast, etc.,) cause you to cough ?      No    Does speaking, singing, or talking on the phone cause you to cough   ?               sometimes   Neurogenic/Airway score        08/03/2014 f/u ov/Abigail Love re: cough x 6 y  Chief Complaint  Patient presents with  . Follow-up    Pt states that her cough is unchanged since the last visit.  No new co's today.   No longer coughing hs, otherwise all the answers to cough questionaire are the same/ did not take gerd rx as rec, still on bcps, did not discuss my concerns with Abigail Love.  rec Neurontin 300 mg three times daily. Pantoprazole (protonix) 40 mg  Take 30-60 min before first meal of the day and Pepcid 20 mg one bedtime until return to office - this is the best way to tell whether stomach acid is contributing to your problem.   09/01/2014 f/u ov/Abigail Love re: cough x 6 years  day >> night on neurotin 300 tid  Chief Complaint  Patient presents with  . Follow-up    Cough is some better since the last visit. She missed a dose of gabapentin yesterday and could notice a big difference "I was hacking up a  lung".       No other obvious day to day or daytime variabilty or assoc sob or cp or chest tightness, subjective wheeze overt sinus or hb symptoms. No unusual exp hx or h/o childhood pna/ asthma or knowledge of premature birth.  Sleeping ok without nocturnal  or early am exacerbation  of respiratory  c/o's or need for noct saba. Also denies any obvious fluctuation of symptoms with weather or environmental changes or other aggravating or alleviating factors except as outlined above   Current Medications, Allergies, Complete Past Medical History, Past Surgical History, Family History, and Social History were reviewed in [employer] record.  ROS  The following are not active complaints unless bolded sore throat, dysphagia, dental problems, itching, sneezing,  nasal congestion at hs or excess/ purulent secretions, ear ache,   fever, chills, sweats, unintended wt loss, pleuritic or exertional cp, hemoptysis,  orthopnea pnd or leg swelling, presyncope, palpitations, heartburn, abdominal pain, anorexia, nausea, vomiting, diarrhea  or change in bowel or urinary habits, change in stools or urine, dysuria,hematuria,  rash, arthralgias, visual complaints, headache, numbness weakness or ataxia or problems with walking or coordination,  change in mood/affect or memory.                      Objective:   Physical Exam   amb wf nad with freq throat clearing   06/01/2014         174 > 06/23/2014  172 > 08/03/2014  179 > 09/01/2014 182  Wt Readings from Last 3 Encounters:  05/18/14 174 lb 6.4 oz (79.107 kg)       HEENT: nl dentition, turbinates, and orophanx. Nl external ear canals without cough reflex   NECK :  without JVD/Nodes/TM/ nl carotid upstrokes bilaterally   LUNGS: no acc muscle use, clear to A and P bilaterally without cough on insp or exp maneuvers   CV:  RRR  no s3 or murmur or increase in P2, no edema   ABD:  soft and nontender with nl excursion in the supine  position. No bruits or organomegaly, bowel sounds nl  MS:  warm without deformities, calf tenderness, cyanosis or clubbing     05/07/14 cxr nl      Assessment & Plan:

## 2014-09-01 NOTE — Assessment & Plan Note (Addendum)
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

## 2014-09-01 NOTE — Patient Instructions (Addendum)
Elavil 10 mg at bedtime  Increase the neurontin to 300 mg to four times daily   For drainage> continue to take chlortrimeton (chlorpheniramine) 4 mg every 4 hours available over the counter (may cause drowsiness) and use non mint/ non menthol candy to prevent excessive throat clearing  Please see patient coordinator before you leave today  to schedule Voice Center / Dr Joya Gaskins at [facility]

## 2015-05-22 ENCOUNTER — Ambulatory Visit (INDEPENDENT_AMBULATORY_CARE_PROVIDER_SITE_OTHER): Payer: Managed Care, Other (non HMO) | Admitting: Physician Assistant

## 2015-05-22 VITALS — BP 126/76 | HR 100 | Temp 99.1°F | Resp 16 | Ht 67.0 in | Wt 180.4 lb

## 2015-05-22 DIAGNOSIS — R059 Cough, unspecified: Secondary | ICD-10-CM

## 2015-05-22 DIAGNOSIS — R05 Cough: Secondary | ICD-10-CM

## 2015-05-22 MED ORDER — HYDROCODONE-CHLORPHENIRAMINE 5-4 MG/5ML PO SOLN: 5.0000 mL | Freq: Two times a day (BID) | ORAL | Status: DC | PRN

## 2015-05-22 MED ORDER — GUAIFENESIN ER 1200 MG PO TB12: 1.0000 | ORAL_TABLET | Freq: Two times a day (BID) | ORAL | Status: DC | PRN

## 2015-05-22 MED ORDER — AZITHROMYCIN 250 MG PO TABS: ORAL_TABLET | ORAL | Status: DC

## 2015-05-22 NOTE — Progress Notes (Signed)
Patient ID: Abigail Love, female    DOB: [birth year], 54 y.o.   MRN: [id number]  PCP: Vidal Schwalbe, MD  Subjective:   Chief Complaint  Patient presents with  . Cough    HPI Presents for evaluation of cough.  This cough started yesterday. 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   This cough is "mucousy" and is accompanied by chest pain, nasal/sinus congestion, subjective chills earlier today. No GI/GU symptoms. Mild headache. No OTC cough/cold remedies.  History of reaction (increased coughing) to albuterol nebulizer treatment during methacholine challenge 08/20/2014.   Review of Systems  Constitutional: Positive for chills and fatigue. Negative for fever.  HENT: Positive for congestion, postnasal drip, rhinorrhea and sore throat (only with cough). Negative for ear discharge, ear pain, sinus pressure, sneezing, trouble swallowing and voice change.   Respiratory: Positive for cough (occasionally productive of yellow sputum). Negative for chest tightness, shortness of breath and wheezing.   Cardiovascular: Positive for chest pain (with cough). Negative for palpitations and leg swelling.  Gastrointestinal: Negative for nausea, vomiting, abdominal pain and diarrhea.  Genitourinary: Negative for dysuria, urgency and frequency.  Neurological: Negative for dizziness, weakness, light-headedness and headaches.       Patient Active Problem List   Diagnosis Date Noted  . Upper airway cough syndrome 05/18/2014     Prior to Admission medications   Medication Sig Start Date End Date Taking? Authorizing Provider  gabapentin (NEURONTIN) 300 MG capsule Take 1 capsule (300 mg total) by mouth 4 (four) times daily. 09/01/14  Yes Tanda Rockers, MD  GILDESS FE 1/20 1-20 MG-MCG tablet Take 1 tablet by mouth daily.  11/16/13  Yes Historical  Provider, MD     No Known Allergies     Objective:  Physical Exam  Constitutional: She is oriented to person, place, and time. Vital signs are normal. She appears well-developed and well-nourished. She is active and cooperative. No distress.  BP 126/76 mmHg  Pulse 100  Temp(Src) 99.1 F (37.3 C) (Oral)  Resp 16  Ht 5\' 7"  (1.702 m)  Wt 180 lb 6.4 oz (81.829 kg)  BMI 28.25 kg/m2  SpO2 98%  LMP 05/01/2015  HENT:  Head: Normocephalic and atraumatic.  Right Ear: Hearing, tympanic membrane, external ear and ear canal normal.  Left Ear: Hearing, tympanic membrane, external ear and ear canal normal.  Nose: Nose normal.  Mouth/Throat: Uvula is midline, oropharynx is clear and moist and mucous membranes are normal. Normal dentition.  Eyes: Conjunctivae are normal. No scleral icterus.  Neck: Normal range of motion. Neck supple. No thyromegaly present.  Cardiovascular: Normal rate, regular rhythm, normal heart sounds and normal pulses.   Pulses:      Radial pulses are 2+ on the right side, and 2+ on the left side.  Pulmonary/Chest: Effort normal.  Breath sounds are coarse, but without wheezing or rhonchi  Lymphadenopathy:       Head (right side): No tonsillar, no preauricular, no posterior auricular and no occipital adenopathy present.       Head (left side): No tonsillar, no preauricular, no posterior auricular and no occipital adenopathy present.    She has no cervical adenopathy.       Right: No supraclavicular adenopathy present.       Left: No supraclavicular adenopathy present.  Neurological: She is alert and oriented to person, place,  and time. She has normal strength. No sensory deficit.  Skin: Skin is warm, dry and intact. No rash noted. No cyanosis or erythema. Nails show no clubbing.  Psychiatric: She has a normal mood and affect. Her speech is normal and behavior is normal.           Assessment & Plan:   1. Cough Cover for bacterial etiology given complicated cough  history. Avoid albuterol due to previous exacerbation of symptoms. Rest, fluids. If symptoms worsens/persist re-evaluate here or with PCP or Dr. Melvyn Novas. - azithromycin (ZITHROMAX) 250 MG tablet; Take 2 tabs PO x 1 dose, then 1 tab PO QD x 4 days  Dispense: 6 tablet; Refill: 0 - Hydrocodone-Chlorpheniramine 5-4 MG/5ML SOLN; Take 5 mLs by mouth every 12 (twelve) hours as needed (cough).  Dispense: 100 mL; Refill: 0 - Guaifenesin (MUCINEX MAXIMUM STRENGTH) 1200 MG TB12; Take 1 tablet (1,200 mg total) by mouth every 12 (twelve) hours as needed.  Dispense: 14 tablet; Refill: 1   Fara Chute, PA-C Physician Assistant-Certified [facility]

## 2015-05-22 NOTE — Patient Instructions (Signed)
Get plenty of rest and drink at least 64 ounces of water daily. 

## 2016-10-01 DIAGNOSIS — R002 Palpitations: Secondary | ICD-10-CM

## 2016-10-01 HISTORY — DX: Palpitations: R00.2

## 2017-06-17 ENCOUNTER — Telehealth: Payer: Self-pay | Admitting: Internal Medicine

## 2017-06-17 NOTE — Telephone Encounter (Signed)
Fine with me - I thought the bcps might be playing a role in her symptoms and see they are still listed as active in EPIC  - previously refused to try off and now considering hysterectomy so maybe now this issue will be finally resolved

## 2017-06-17 NOTE — Telephone Encounter (Signed)
Spoke with pt, she states she saw Dr. 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 She then got a second 2nd opinion and they stated she should have surgery to vocal cords. She went to [facility] and they dx's her with chronic cough and Rx's Gabapentin but it didn't help the cough. She is trying to control it but the cough is terrible and keeping her up at night. She is having a hysterectomy and is feeling worried about the cough effecting her recovery after surgery. She states someone recommended Dr. Lake Bells and she wants to see him for this issue.  Dr. Melvyn Novas is ok to change and Dr. [place] Bells are you willing to take pt? Please advise.

## 2017-06-18 NOTE — Telephone Encounter (Signed)
I may open up slots in the first week of October.  Have her call back in a week.

## 2017-06-18 NOTE — Telephone Encounter (Signed)
Pt is aware of BQ's recommendations and voiced her understanding. Nothing further needed at this time.

## 2017-06-18 NOTE — Telephone Encounter (Signed)
BQ please advise if you are okay with taking over this pt's care. Thanks.

## 2017-06-18 NOTE — Telephone Encounter (Signed)
OK by me 

## 2017-06-18 NOTE — Telephone Encounter (Addendum)
Spoke with pt and advised message from [facility] and BQ. She would like to see BQ before 08/13/2017 because that is her surgery date. BQ you do not have a 30 minute slot anywhere before that date, can we squeeze her in, there are a couple 15 min slots but wanted your permission first since these require a 30 minute visit. Please advise.

## 2017-07-15 ENCOUNTER — Ambulatory Visit (INDEPENDENT_AMBULATORY_CARE_PROVIDER_SITE_OTHER)
Admission: RE | Admit: 2017-07-15 | Discharge: 2017-07-15 | Disposition: A | Discharge status: Home / Self Care | Payer: 59 | Source: Ambulatory Visit | Attending: Pulmonary Disease | Admitting: Pulmonary Disease

## 2017-07-15 ENCOUNTER — Encounter: Payer: Self-pay | Admitting: Pulmonary Disease

## 2017-07-15 ENCOUNTER — Ambulatory Visit (INDEPENDENT_AMBULATORY_CARE_PROVIDER_SITE_OTHER): Payer: 59 | Admitting: Pulmonary Disease

## 2017-07-15 VITALS — BP 142/76 | HR 106 | Ht 67.0 in | Wt 177.0 lb

## 2017-07-15 DIAGNOSIS — R05 Cough: Secondary | ICD-10-CM

## 2017-07-15 DIAGNOSIS — K219 Gastro-esophageal reflux disease without esophagitis: Secondary | ICD-10-CM

## 2017-07-15 DIAGNOSIS — R053 Chronic cough: Secondary | ICD-10-CM

## 2017-07-15 DIAGNOSIS — J3089 Other allergic rhinitis: Secondary | ICD-10-CM

## 2017-07-15 LAB — NITRIC OXIDE: Nitric Oxide: 14

## 2017-07-15 IMAGING — DX DG CHEST 2V
2 series · 2 of 2 positions shown · non-contrast
Comparison: Chest x-ray of [DATE].

CLINICAL DATA: Chronic cough common nonsmoker.

EXAM:
CHEST  2 VIEW

[chest pa]
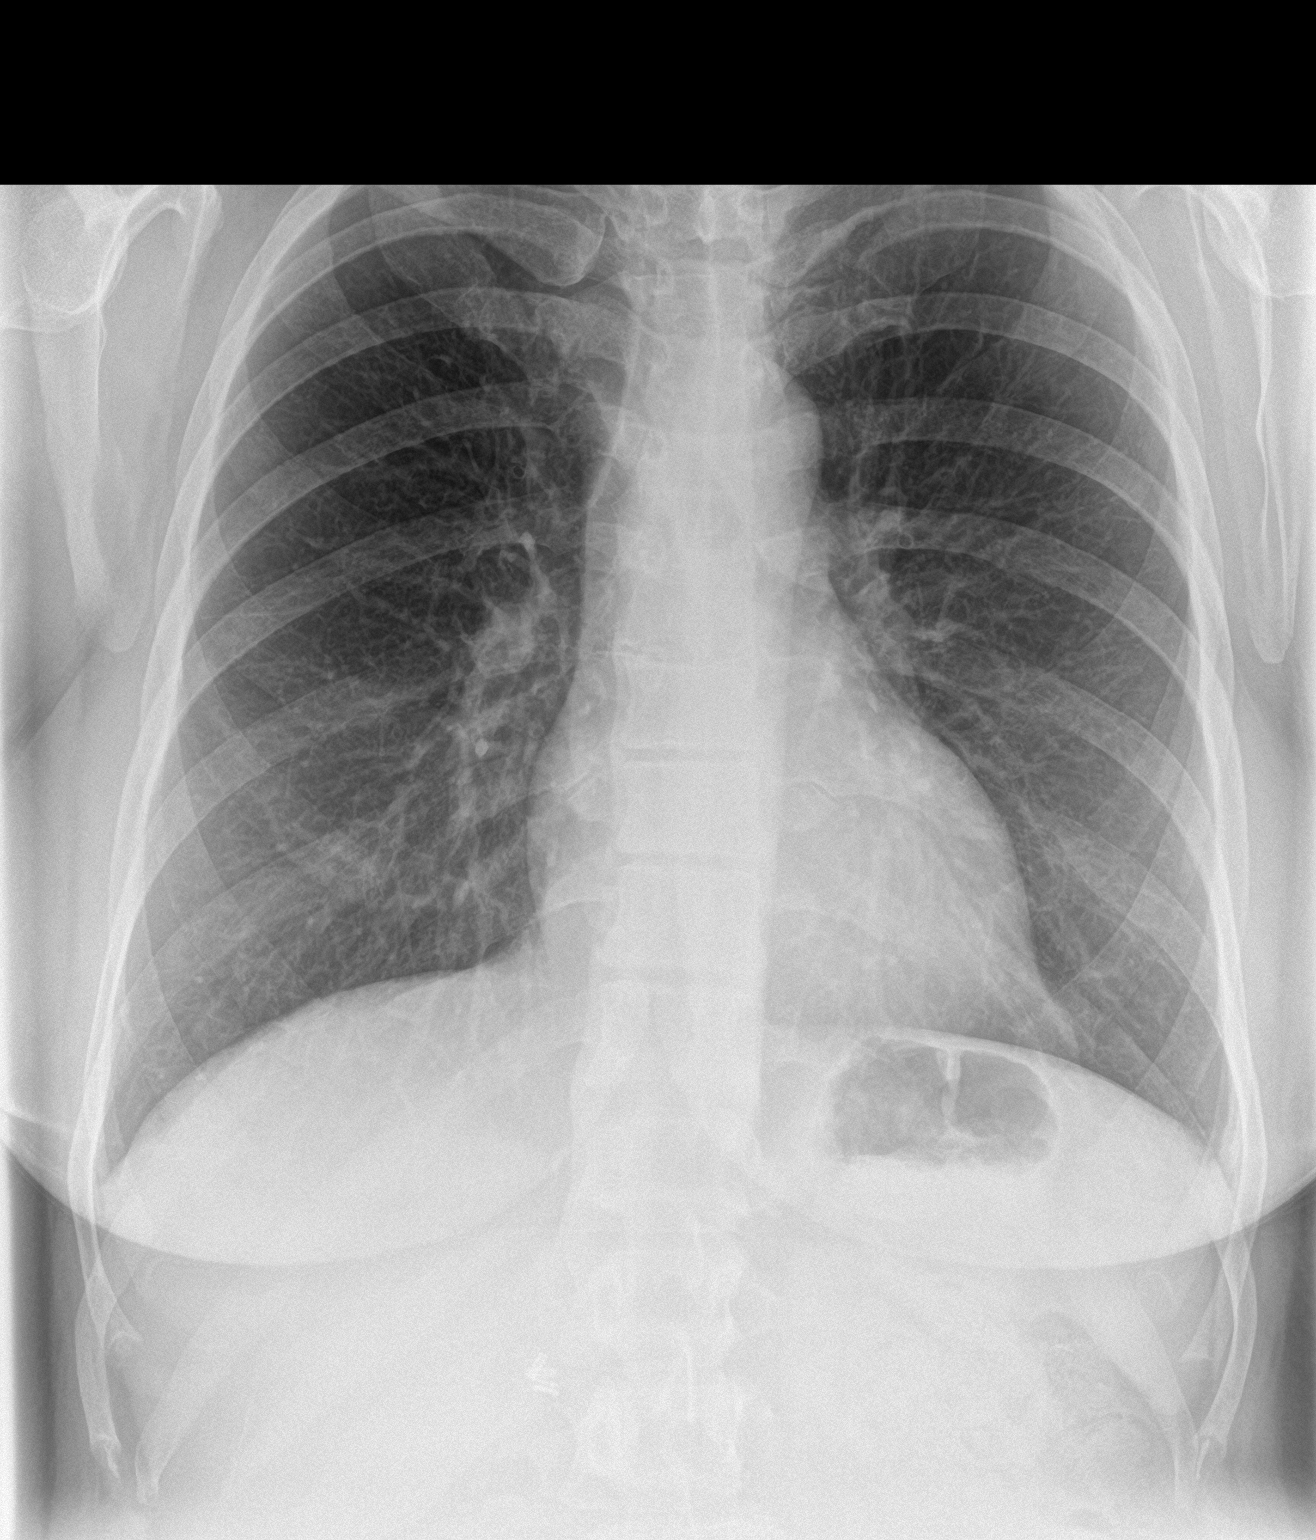

[chest lat]
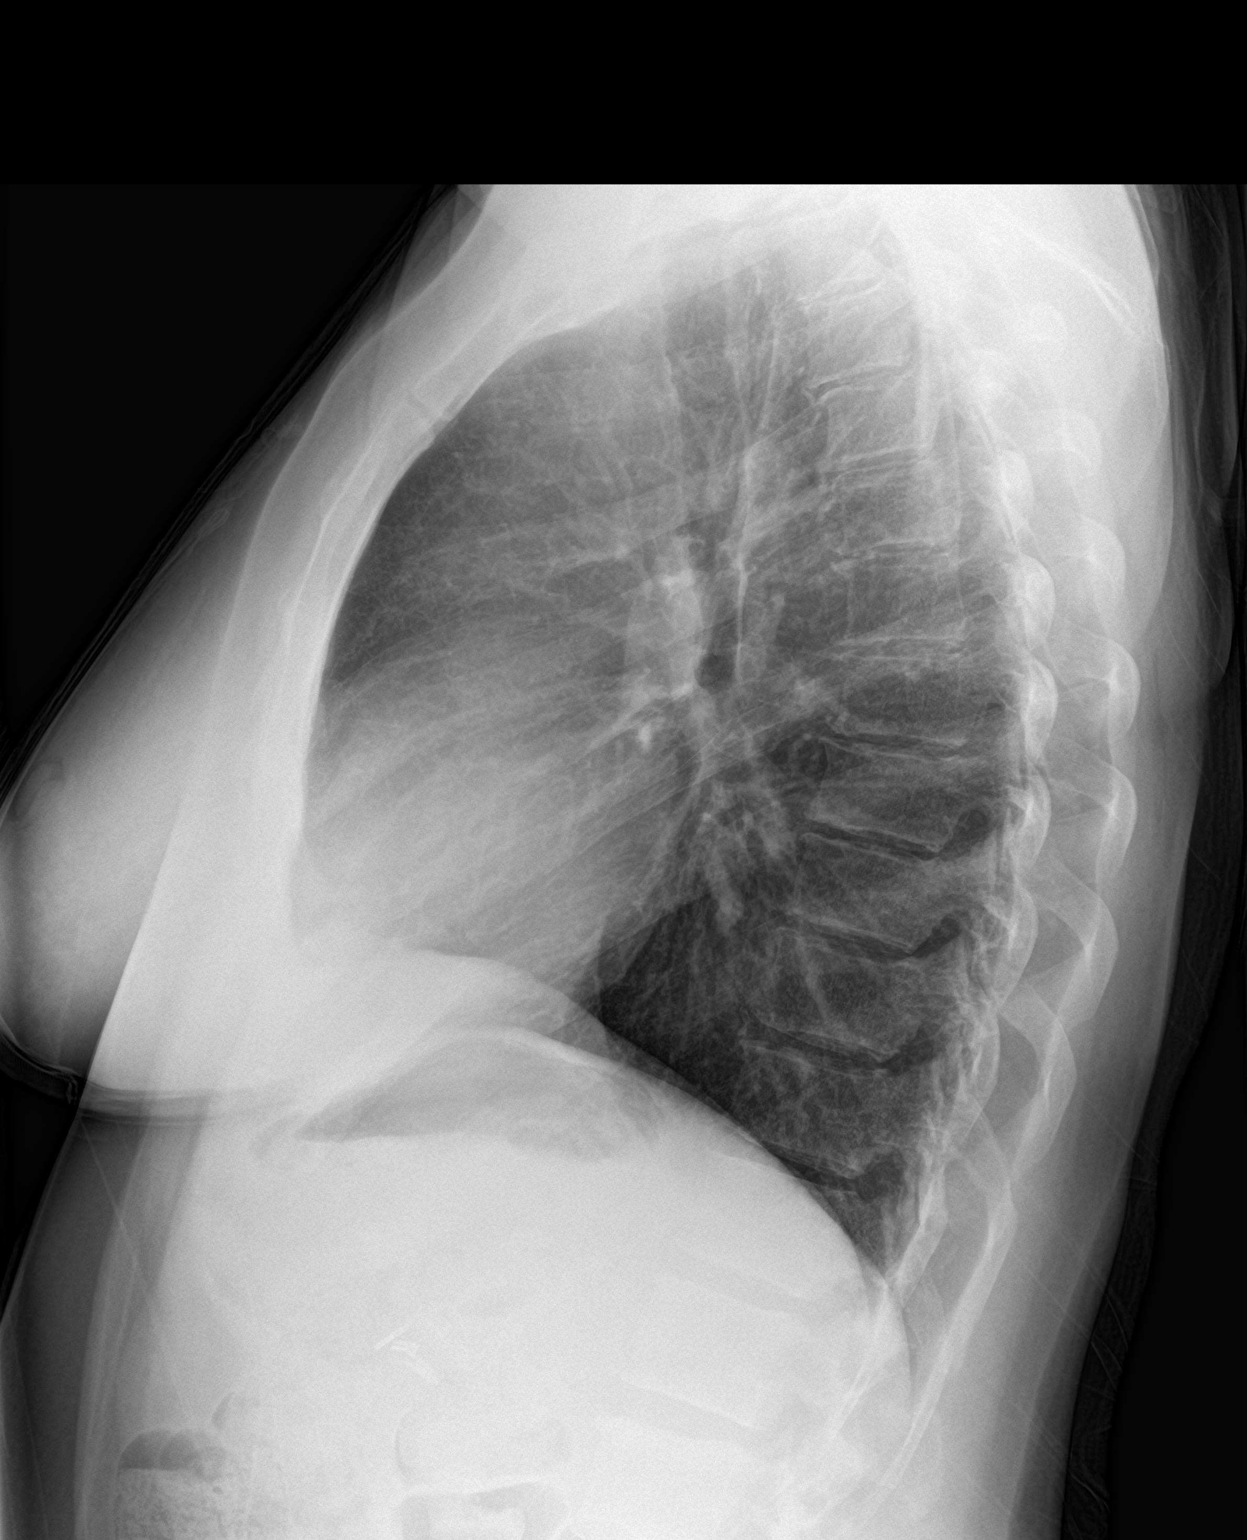

[2 of 2 positions shown; findings below may reference images not displayed]

FINDINGS: The lungs are adequately inflated. There is no focal infiltrate.
There is no pleural effusion. The heart and pulmonary vascularity
are normal. The mediastinum is normal in width. The bony thorax
exhibits no acute abnormality.
IMPRESSION: There is no active cardiopulmonary disease.

## 2017-07-15 NOTE — Patient Instructions (Signed)
For cough: We will assess for asthma and lung problems with: Spirometry test today, exhaled nitric oxide testing today, chest x-ray It's okay to continue taking Tessalon for now  To assess for gastroesophageal reflux: Take Prilosec in the morning, ranitidine at night, follow the gastroesophageal reflux lifestyle modification sheet we gave you If no improvement then I recommend a 24-hour pH probe  For allergic rhinitis: We will refer you to [place] asthma and allergy Keep taking Chlor-Trimeton  We will see you back in 2 weeks to see if the acid suppressive therapy has helped her cough or not, nurse practitioner visit okay

## 2017-07-15 NOTE — Progress Notes (Signed)
Subjective:    Patient ID: Abigail Love, female    DOB: October 25, 1960, 56 y.o.   MRN: [id number]  Synopsis: Former patient of Dr. Melvyn Novas who came back in October 2018 for evaluation of upper airway cough syndrome. Dr. Melvyn Novas described her situation as follows: - Allergy eval  2013 neg Bardelas - cyclical cough regimen 3/50/0938 > repeat over labor day weekend planned 2015  - Sinus CT  06/04/2014 > Normal paranasal sinuses - Neurontin rx 06/23/2014 > no better on 100 tid 08/03/14 so increased to 300 tid - Methacholine challenge testing  Aug 19 2014 > neg for asthma / neg for cough provocation   HPI Chief Complaint  Patient presents with  . Advice Only    Cough: > she was assessed her by one of our pulmonary physicians years ago and was referred to the [facility] and then to [facility] > she took high doses of gabapentin which helped lessened the severity of the cough but she didn't like taking it so she weaned off the medicine a year ago > since a year ago her cough has been worsening > A couple of weeks ago she had a virus and was given Ladona Ridgel which helped.   > chlortrimeton helps her feel less congested > she says that the cough has been worsening and making her more short of breath > she coughs all night long and it keeps her up > she describes it as a [place] old man loud cough" > she says that it's bad all the time, perhaps its worse at night > she was recently around a moldy church and this made the cough worse, she had some associated chest tightness > if she is bending over she feels more congested  More runny nose and sneezing in the last few years.  Singulair never really helped her.  Antacid therapy may have helped when she was on something "really strong" by the ENT group at [facility].  She thinks they may have helped some.    She has a pending surgery in November.  Past Medical History:  Diagnosis Date  . Allergic rhinitis   . Cough   . Fatigue   . Fibroids     . Palpitations 2018  . Tachycardia      Family History  Problem Relation Age of Onset  . Lung cancer Father   . Hypertension Unknown   . Lung cancer Mother   . COPD Mother   . Cancer Paternal Grandmother      Social History   Social History  . Marital status: Divorced    Spouse name: n/a  . Number of children: 3  . Years of education: college   Occupational History  . [employer]    Social History Main Topics  . Smoking status: Never Smoker  . Smokeless tobacco: Never Used     Comment: both parents smoked heavily   . Alcohol use 0.0 oz/week     Comment: social  . Drug use: No  . Sexual activity: Not on file   Other Topics Concern  . Not on file   Social History Narrative   Lives alone. Her youngest daughter just started at [employer]. One son has just graduated college. Her other son is at [employer].     No Active Allergies   Outpatient Medications Prior to Visit  Medication Sig Dispense Refill  . GILDESS FE 1/20 1-20 MG-MCG tablet Take 1 tablet by mouth daily.     Marland Kitchen  azithromycin (ZITHROMAX) 250 MG tablet Take 2 tabs PO x 1 dose, then 1 tab PO QD x 4 days (Patient not taking: Reported on 07/15/2017) 6 tablet 0  . gabapentin (NEURONTIN) 300 MG capsule Take 1 capsule (300 mg total) by mouth 4 (four) times daily. (Patient not taking: Reported on 07/15/2017) 120 capsule 2  . Guaifenesin (MUCINEX MAXIMUM STRENGTH) 1200 MG TB12 Take 1 tablet (1,200 mg total) by mouth every 12 (twelve) hours as needed. (Patient not taking: Reported on 07/15/2017) 14 tablet 1  . Hydrocodone-Chlorpheniramine 5-4 MG/5ML SOLN Take 5 mLs by mouth every 12 (twelve) hours as needed (cough). (Patient not taking: Reported on 07/15/2017) 100 mL 0   No facility-administered medications prior to visit.       Review of Systems  Constitutional: Negative for fever and unexpected weight change.  HENT: Positive for congestion. Negative for dental problem, ear pain, nosebleeds,  postnasal drip, rhinorrhea, sinus pressure, sneezing, sore throat and trouble swallowing.   Eyes: Negative for redness and itching.  Respiratory: Positive for cough. Negative for chest tightness, shortness of breath and wheezing.   Cardiovascular: Negative for palpitations and leg swelling.  Gastrointestinal: Negative for nausea and vomiting.  Genitourinary: Negative for dysuria.  Musculoskeletal: Negative for joint swelling.  Skin: Negative for rash.  Neurological: Negative for headaches.  Hematological: Does not bruise/bleed easily.  Psychiatric/Behavioral: Negative for dysphoric mood. The patient is not nervous/anxious.        Objective:   Physical Exam Vitals:   07/15/17 1446  BP: (!) 142/76  Pulse: (!) 106  SpO2: 99%  Weight: 177 lb (80.3 kg)  Height: 5\' 7"  (1.702 m)   Gen: frequent cough, well appearing, no acute distress HENT: NCAT, OP clear, neck supple without masses Eyes: PERRL, EOMi Lymph: no cervical lymphadenopathy PULM: CTA B CV: RRR, no mgr, no JVD GI: BS+, soft, nontender, no hsm Derm: no rash or skin breakdown MSK: normal bulk and tone Neuro: A&Ox4, CN II-XII intact, strength 5/5 in all 4 extremities Psyche: normal mood and affect   CBC No results found for: WBC, RBC, HGB, HCT, PLT, MCV, MCH, MCHC, RDW, LYMPHSABS, MONOABS, EOSABS, BASOSABS    Records from her visit with Dr. Patrice Paradise in the [facility] reviewed. She was last seen there in 2016. He had recommended an esophageal pre-H probe to assess for gastroesophageal reflux disease, the patient declined this test. He also recommended laryngoscopy which she also declined. He was treating her postnasal drip with Astelin and saline rinses and reflux with Pepcid.  Other imaging: - Sinus CT  06/04/2014 > Normal paranasal sinuses - Neurontin rx 06/23/2014 > no better on 100 tid 08/03/14 so increased to 300 tid  PFT: - Methacholine challenge testing  Aug 19 2014 > neg for asthma / neg for cough  provocation      Assessment & Plan:   Chronic cough - Plan: Spirometry with graph, Nitric oxide  Gastroesophageal reflux disease, esophagitis presence not specified  Allergic rhinitis due to other allergic trigger, unspecified seasonality  Discussion: She has chronic refractory coughwhich has been assessed extensively by multiple subspecialists at our center and multiple academic centers over the last several years. The conclusion from the other experts who assessed her was that she had irritable larynx syndrome. Some people describe this as cyclical cough or upper airway cough syndrome.  Lately her cough has worsened againwhile she has been off of all therapy. I think it's reasonable to go back to basics and assess her allergic rhinitis,  acid reflux, and perform spirometry and CXR to assess for lung disease.  However, if these tests are all normal then I think the most appropriate approach would be to get her back on therapy which worked for her in the past, specifically gabapentin, allergic rhinitis therapy.  Plan: For cough: We will assess for asthma and lung problems with: Spirometry test today, exhaled nitric oxide testing today, chest x-ray It's okay to continue taking Tessalon for now  To assess for gastroesophageal reflux: Take Prilosec in the morning, ranitidine at night, follow the gastroesophageal reflux lifestyle modification sheet we gave you If no improvement then I recommend a 24-hour pH probe  For allergic rhinitis: We will refer you to [facility] Keep taking Chlor-Trimeton  We will see you back in 2 weeks to see if the acid suppressive therapy has helped her cough or not, nurse practitioner visit okay    Current Outpatient Prescriptions:  .  benzonatate (TESSALON) 100 MG capsule, Take 100 mg by mouth 3 (three) times daily as needed for cough., Disp: , Rfl:  .  GILDESS FE 1/20 1-20 MG-MCG tablet, Take 1 tablet by mouth daily. , Disp: , Rfl:

## 2017-07-22 NOTE — H&P (Signed)
Abigail Love  DICTATION # [id number] CSN# [id number]   Margarette Asal, MD 07/22/2017 1:07 PM

## 2017-07-23 NOTE — H&P (Signed)
Abigail Love, Abigail Love                 ACCOUNT NO.:  [id number]  MEDICAL RECORD NO.:  [id number]  LOCATION:                                 FACILITY:  PHYSICIAN:  Ralene Bathe. Matthew Saras, M.D.    DATE OF BIRTH:  DATE OF ADMISSION: DATE OF DISCHARGE:                             HISTORY & PHYSICAL   CHIEF COMPLAINT:  Symptomatic leiomyoma.  HISTORY OF PRESENT ILLNESS:  A 56 year old with known fibroids who has continued to have worsening problems related to dysmenorrhea, not relieved by narcotic medications, who presents at this time for definitive hysterectomy with bilateral salpingectomy.  This procedure including specific risks related to bleeding, infection, transfusion, wound infection, phlebitis, adjacent organ injury, along with her expected recovery time all discussed which she understands and accepts. Last ultrasound dated September 2017 showed fibroids 3.0 x 2.7, 3.6 x 2.3, 5.5 x 4.7 and 3.2 x 3.3.  ALLERGIES:  None.  CURRENT MEDICATIONS:  Loestrin, Percocet p.r.n.  PAST SURGICAL HISTORY:  Vaginal delivery x3, prior cholecystectomy.  FAMILY HISTORY:  Significant for history of respiratory disease. Otherwise, negative for cancer, diabetes or heart disease.  SOCIAL HISTORY:  Denies tobacco or drug use.  She does drink alcohol socially.  Dr. Dema Severin is her PCP.  Last Pap dated January 2018 was normal.  PHYSICAL EXAMINATION:  VITAL SIGNS:  Temp 98.2, blood pressure 128/80. Weight is 180 pounds. HEENT:  Unremarkable. NECK:  Supple without masses. LUNGS:  Clear. CARDIOVASCULAR:  Regular rate and rhythm without murmurs, rubs, gallops. BREASTS:  Without masses. ABDOMEN:  Soft, flat, nontender. PELVIC:  Vulva, vagina, cervix normal.  Uterus was 12-week size.  Adnexa unremarkable.  IMPRESSION:  Symptomatic leiomyoma.  PLAN:  TAH and bilateral salpingectomy.  Procedure and risks discussed as above.     Abigail Love M. Matthew Saras, M.D.   ______________________________ Ralene Bathe.  Matthew Saras, M.D.    RM[facility]/MEDQ  D:  07/22/2017  T:  07/22/2017  Job:  [id number]

## 2017-08-05 ENCOUNTER — Encounter: Payer: Self-pay | Admitting: Pulmonary Disease

## 2017-08-05 ENCOUNTER — Ambulatory Visit (INDEPENDENT_AMBULATORY_CARE_PROVIDER_SITE_OTHER): Payer: 59 | Admitting: Pulmonary Disease

## 2017-08-05 VITALS — BP 128/68 | HR 94 | Ht 67.0 in | Wt 177.0 lb

## 2017-08-05 DIAGNOSIS — J301 Allergic rhinitis due to pollen: Secondary | ICD-10-CM

## 2017-08-05 DIAGNOSIS — K219 Gastro-esophageal reflux disease without esophagitis: Secondary | ICD-10-CM

## 2017-08-05 DIAGNOSIS — R058 Other specified cough: Secondary | ICD-10-CM

## 2017-08-05 DIAGNOSIS — R05 Cough: Secondary | ICD-10-CM | POA: Diagnosis not present

## 2017-08-05 MED ORDER — MONTELUKAST SODIUM 10 MG PO TABS: 10.0000 mg | ORAL_TABLET | Freq: Every day | ORAL | 11 refills | Status: DC

## 2017-08-05 MED ORDER — AZELASTINE HCL 0.1 % NA SOLN: 1.0000 | Freq: Two times a day (BID) | NASAL | 11 refills | Status: DC

## 2017-08-05 NOTE — Patient Instructions (Signed)
Allergic rhinitis: To maximize therapy for this short of immunotherapy I recommend the following: Add Singulair Use Astelin 2 puffs twice a day Continue Flonase 2 puffs daily If no improvement in follow-up with asthma and allergy and consider immunotherapy  Gastroesophageal reflux disease: Continue Prilosec in the morning and Pepcid at night If you continue to have trouble with cough and the sensation that there is mucus on your vocal cords and I recommend 24-hour pH and impedance probing with manometry  Cyclical cough, irritable larynx syndrome: Try your best to stop clearing her throat Use hard candies warm beverages to help soothe your throat rather than clearing her throat Focus on suppressing her cough  We will see you back after your surgery, about 6 weeks

## 2017-08-05 NOTE — Progress Notes (Signed)
Subjective:    Patient ID: Abigail Love, female    DOB: 26-Mar-1961, 56 y.o.   MRN: [id number]  Synopsis: Former patient of Dr. Melvyn Love who came back in October 2018 for evaluation of upper airway cough syndrome. Dr. Melvyn Love described her situation as follows: - Allergy eval  2013 neg Bardelas - cyclical cough regimen 1/32/4401 > repeat over labor day weekend planned 2015  - Sinus CT  06/04/2014 > Normal paranasal sinuses - Neurontin rx 06/23/2014 > no better on 100 tid 08/03/14 so increased to 300 tid - Methacholine challenge testing  Aug 19 2014 > neg for asthma / neg for cough provocation   HPI Chief Complaint  Patient presents with  . Follow-up    cough is unchanged.      Since the last visit Abigail Love has struggled with cough.  She says that taking cetirizine may have helped a little bit.  She saw the asthma and allergy team and they recommended that she continue on current treatment and consider immunotherapy.  She is using Flonase.  She is taking Prilosec in the morning and Pepcid in the evening.  She says that she still has persistent cough throughout the day which is worse at 3 to 4:00 in the afternoon.  No shortness of breath no mucus production.  She says that she has the sensation that there is mucus on her vocal cords.  She says that she is waking up coughing at night.  Overall she is frustrated with the severity of her cough.  Past Medical History:  Diagnosis Date  . Allergic rhinitis   . Cough   . Fatigue   . Fibroids   . Palpitations 2018  . Tachycardia        Review of Systems  Constitutional: Negative for fever and unexpected weight change.  HENT: Positive for congestion. Negative for dental problem, ear pain, nosebleeds, postnasal drip, rhinorrhea, sinus pressure, sneezing, sore throat and trouble swallowing.   Eyes: Negative for redness and itching.  Respiratory: Positive for cough. Negative for chest tightness, shortness of breath and wheezing.   Cardiovascular:  Negative for palpitations and leg swelling.  Gastrointestinal: Negative for nausea and vomiting.  Genitourinary: Negative for dysuria.  Musculoskeletal: Negative for joint swelling.  Skin: Negative for rash.  Neurological: Negative for headaches.  Hematological: Does not bruise/bleed easily.  Psychiatric/Behavioral: Negative for dysphoric mood. The patient is not nervous/anxious.        Objective:   Physical Exam Vitals:   08/05/17 0909  BP: 128/68  Pulse: 94  SpO2: 99%  Weight: 177 lb (80.3 kg)  Height: 5\' 7"  (1.702 m)    Gen: well appearing HENT: OP clear, TM's clear, neck supple PULM: CTA B, normal percussion CV: RRR, no mgr, trace edema GI: BS+, soft, nontender Derm: no cyanosis or rash Psyche: normal mood and affect    CBC No results found for: WBC, RBC, HGB, HCT, PLT, MCV, MCH, MCHC, RDW, LYMPHSABS, MONOABS, EOSABS, BASOSABS  Records from her visit with asthma and allergy reviewed, they recommended that she consider immunotherapy  Records from her visit with Dr. Patrice Love in the [facility] center reviewed. She was last seen there in 2016. He had recommended an esophageal pre-H probe to assess for gastroesophageal reflux disease, the patient declined this test. He also recommended laryngoscopy which she also declined. He was treating her postnasal drip with Astelin and saline rinses and reflux with Pepcid.  Other imaging: - Sinus CT  06/04/2014 > Normal paranasal sinuses -  Neurontin rx 06/23/2014 > no better on 100 tid 08/03/14 so increased to 300 tid  PFT: - Methacholine challenge testing  Aug 19 2014 > neg for asthma / neg for cough provocation      Assessment & Plan:   Seasonal allergic rhinitis due to pollen  Upper airway cough syndrome  Gastroesophageal reflux disease, esophagitis presence not specified  Discussion: Since the last visit Abigail Love continues to struggle with cough.  At this time it still not clear to me that allergic rhinitis is completely  controlled, we do not know for certain if acid reflux has been controlled.  Further, she has a significant irritable larynx type syndrome with a lot of sensitivity around her vocal cords.  During her visit today she cleared her throat probably 15-20 times during the visit.  I expanded her that this will only exacerbate cough.  At this time there is no clear evidence that she has an underlying lung disease causing her to cough.  She is not interested in using gabapentin right now.  She has not completely treated allergic rhinitis or had an extensive evaluation for gastroesophageal reflux disease.  Plan: Allergic rhinitis: To maximize therapy for this short of immunotherapy I recommend the following: Add Singulair Use Astelin 2 puffs twice a day Continue Flonase 2 puffs daily If no improvement in follow-up with asthma and allergy and consider immunotherapy  Gastroesophageal reflux disease: Continue Prilosec in the morning and Pepcid at night If you continue to have trouble with cough and the sensation that there is mucus on your vocal cords and I recommend 24-hour pH and impedance probing with manometry  Cyclical cough, irritable larynx syndrome: Try your best to stop clearing her throat Use hard candies warm beverages to help soothe your throat rather than clearing her throat Focus on suppressing her cough  We will see you back after your surgery, about 6 weeks   Current Outpatient Medications:  .  benzonatate (TESSALON) 100 MG capsule, Take 200 mg by mouth 2 (two) times daily as needed for cough. , Disp: , Rfl:  .  cetirizine (ZYRTEC) 10 MG tablet, Take 10 mg by mouth at bedtime., Disp: , Rfl:  .  GILDESS FE 1/20 1-20 MG-MCG tablet, Take 1 tablet by mouth daily. , Disp: , Rfl:  .  ibuprofen (ADVIL,MOTRIN) 200 MG tablet, Take 800 mg by mouth daily as needed for moderate pain., Disp: , Rfl:  .  omeprazole (PRILOSEC) 20 MG capsule, Take 20 mg by mouth daily., Disp: , Rfl:  .   oxyCODONE-acetaminophen (PERCOCET/ROXICET) 5-325 MG tablet, Take 1 tablet by mouth every 8 (eight) hours as needed for moderate pain. , Disp: , Rfl:  .  ranitidine (ZANTAC) 150 MG tablet, Take 150 mg by mouth at bedtime., Disp: , Rfl:  .  azelastine (ASTELIN) 0.1 % nasal spray, Place 1 spray 2 (two) times daily into both nostrils. Use in each nostril as directed, Disp: 30 mL, Rfl: 11 .  montelukast (SINGULAIR) 10 MG tablet, Take 1 tablet (10 mg total) at bedtime by mouth., Disp: 30 tablet, Rfl: 11

## 2017-08-05 NOTE — Patient Instructions (Addendum)
Your procedure is scheduled on:  Tuesday, Nov 13  Enter through the Main Entrance of [facility] at: 6 am  Pick up the phone at the desk and dial [phone].  Call this number if you have problems the morning of surgery: [phone].  Remember: Do NOT eat food or drink clear liquids (including water) after midnight Monday.    Take these medicines the morning of surgery with a SIP OF WATER: prilosec and tessalon tablet if needed.  ok to use nasal spray if needed.  Do NOT wear jewelry (body piercing), metal hair clips/bobby pins, make-up, or nail polish. Do NOT wear lotions, powders, or perfumes.  You may wear deoderant. Do NOT shave for 48 hours prior to surgery. Do NOT bring valuables to the hospital. Contacts may not be worn into surgery.  Leave suitcase in car.  After surgery it may be brought to your room.  For patients admitted to the hospital, checkout time is 11:00 AM the day of discharge. Have a responsible adult drive you home and stay with you for 24 hours after your procedure.  Home with [employer] cell [phone].

## 2017-08-06 ENCOUNTER — Encounter (HOSPITAL_COMMUNITY)
Admission: RE | Admit: 2017-08-06 | Discharge: 2017-08-06 | Disposition: A | Discharge status: Home / Self Care | Payer: 59 | Source: Ambulatory Visit | Attending: Obstetrics and Gynecology | Admitting: Obstetrics and Gynecology

## 2017-08-06 ENCOUNTER — Other Ambulatory Visit: Payer: Self-pay

## 2017-08-06 ENCOUNTER — Encounter (HOSPITAL_COMMUNITY): Payer: Self-pay

## 2017-08-06 DIAGNOSIS — Z79899 Other long term (current) drug therapy: Secondary | ICD-10-CM | POA: Diagnosis not present

## 2017-08-06 DIAGNOSIS — J302 Other seasonal allergic rhinitis: Secondary | ICD-10-CM | POA: Insufficient documentation

## 2017-08-06 DIAGNOSIS — R05 Cough: Secondary | ICD-10-CM | POA: Diagnosis not present

## 2017-08-06 DIAGNOSIS — Z01812 Encounter for preprocedural laboratory examination: Secondary | ICD-10-CM | POA: Insufficient documentation

## 2017-08-06 DIAGNOSIS — K219 Gastro-esophageal reflux disease without esophagitis: Secondary | ICD-10-CM | POA: Insufficient documentation

## 2017-08-06 DIAGNOSIS — Z79891 Long term (current) use of opiate analgesic: Secondary | ICD-10-CM | POA: Diagnosis not present

## 2017-08-06 HISTORY — DX: Cough: R05

## 2017-08-06 HISTORY — DX: Chronic cough: R05.3

## 2017-08-06 HISTORY — DX: Anemia, unspecified: D64.9

## 2017-08-06 LAB — CBC
HCT: 32 % — ABNORMAL LOW (ref 36.0–46.0)
Hemoglobin: 10.2 g/dL — ABNORMAL LOW (ref 12.0–15.0)
MCH: 23.6 pg — ABNORMAL LOW (ref 26.0–34.0)
MCHC: 31.9 g/dL (ref 30.0–36.0)
MCV: 74.1 fL — ABNORMAL LOW (ref 78.0–100.0)
Platelets: 306 K/uL (ref 150–400)
RBC: 4.32 MIL/uL (ref 3.87–5.11)
RDW: 16.7 % — ABNORMAL HIGH (ref 11.5–15.5)
WBC: 5.4 K/uL (ref 4.0–10.5)

## 2017-08-06 LAB — TYPE AND SCREEN
ABO/RH(D): O POS
Antibody Screen: NEGATIVE

## 2017-08-06 LAB — ABO/RH: ABO/RH(D): O POS

## 2017-08-06 NOTE — Pre-Procedure Instructions (Signed)
Reviewed medical history with Dr Royce Macadamia.  Patient has a chronic cough being followed by Dr. Lake Bells.  Ok per Dr. Royce Macadamia for patient to take tessalon tab on DOS.

## 2017-08-12 NOTE — Anesthesia Preprocedure Evaluation (Addendum)
Anesthesia Evaluation  Patient identified by MRN, date of birth, ID band Patient awake    Reviewed: Allergy & Precautions, H&P , NPO status , Patient's Chart, lab work & pertinent test results  Airway Mallampati: III  TM Distance: >3 FB Neck ROM: Full    Dental no notable dental hx. (+) Teeth Intact, Dental Advisory Given   Pulmonary neg pulmonary ROS,    Pulmonary exam normal breath sounds clear to auscultation       Cardiovascular Exercise Tolerance: Good negative cardio ROS   Rhythm:Regular Rate:Normal     Neuro/Psych negative neurological ROS  negative psych ROS   GI/Hepatic Neg liver ROS, GERD  Medicated and Controlled,  Endo/Other  negative endocrine ROS  Renal/GU negative Renal ROS  negative genitourinary   Musculoskeletal   Abdominal   Peds  Hematology negative hematology ROS (+) anemia ,   Anesthesia Other Findings   Reproductive/Obstetrics negative OB ROS                            Anesthesia Physical Anesthesia Plan  ASA: II  Anesthesia Plan: General   Post-op Pain Management:    Induction: Intravenous  PONV Risk Score and Plan: 4 or greater and Ondansetron, Dexamethasone and Midazolam  Airway Management Planned: Oral ETT  Additional Equipment:   Intra-op Plan:   Post-operative Plan: Extubation in OR  Informed Consent: I have reviewed the patients History and Physical, chart, labs and discussed the procedure including the risks, benefits and alternatives for the proposed anesthesia with the patient or authorized representative who has indicated his/her understanding and acceptance.   Dental advisory given  Plan Discussed with: CRNA  Anesthesia Plan Comments:         Anesthesia Quick Evaluation

## 2017-08-13 ENCOUNTER — Inpatient Hospital Stay (HOSPITAL_COMMUNITY)
Admission: AD | Admit: 2017-08-13 | Discharge: 2017-08-15 | DRG: 743 | Disposition: A | Discharge status: Home / Self Care | Payer: 59 | Source: Ambulatory Visit | Attending: Obstetrics and Gynecology | Admitting: Obstetrics and Gynecology

## 2017-08-13 ENCOUNTER — Inpatient Hospital Stay (HOSPITAL_COMMUNITY): Payer: 59 | Admitting: Anesthesiology

## 2017-08-13 ENCOUNTER — Encounter (HOSPITAL_COMMUNITY): Payer: Self-pay

## 2017-08-13 ENCOUNTER — Encounter (HOSPITAL_COMMUNITY)
Admission: AD | Disposition: A | Discharge status: Home / Self Care | Payer: Self-pay | Source: Ambulatory Visit | Attending: Obstetrics and Gynecology

## 2017-08-13 ENCOUNTER — Other Ambulatory Visit: Payer: Self-pay

## 2017-08-13 DIAGNOSIS — R102 Pelvic and perineal pain: Secondary | ICD-10-CM | POA: Diagnosis present

## 2017-08-13 DIAGNOSIS — D649 Anemia, unspecified: Secondary | ICD-10-CM | POA: Diagnosis present

## 2017-08-13 DIAGNOSIS — N946 Dysmenorrhea, unspecified: Secondary | ICD-10-CM | POA: Diagnosis present

## 2017-08-13 DIAGNOSIS — N920 Excessive and frequent menstruation with regular cycle: Secondary | ICD-10-CM | POA: Diagnosis present

## 2017-08-13 DIAGNOSIS — D259 Leiomyoma of uterus, unspecified: Principal | ICD-10-CM | POA: Diagnosis present

## 2017-08-13 DIAGNOSIS — D219 Benign neoplasm of connective and other soft tissue, unspecified: Secondary | ICD-10-CM | POA: Diagnosis present

## 2017-08-13 HISTORY — PX: ABDOMINAL HYSTERECTOMY: SHX81

## 2017-08-13 HISTORY — PX: OOPHORECTOMY: SHX6387

## 2017-08-13 HISTORY — PX: BILATERAL SALPINGECTOMY: SHX5743

## 2017-08-13 LAB — PREGNANCY, URINE: PREG TEST UR: NEGATIVE

## 2017-08-13 SURGERY — HYSTERECTOMY, ABDOMINAL
Anesthesia: General | Site: Abdomen | Laterality: Right

## 2017-08-13 MED ORDER — HYDROMORPHONE HCL 1 MG/ML IJ SOLN
INTRAMUSCULAR | Status: AC
  Administered 2017-08-13: 0.5 mg via INTRAVENOUS
  Filled 2017-08-13: qty 1

## 2017-08-13 MED ORDER — KETOROLAC TROMETHAMINE 30 MG/ML IJ SOLN
30.0000 mg | Freq: Four times a day (QID) | INTRAMUSCULAR | Status: DC
  Administered 2017-08-13 – 2017-08-14 (×3): 30 mg via INTRAVENOUS
  Filled 2017-08-13 (×3): qty 1

## 2017-08-13 MED ORDER — LORATADINE 10 MG PO TABS
10.0000 mg | ORAL_TABLET | Freq: Every day | ORAL | Status: DC
  Administered 2017-08-13: 10 mg via ORAL
  Filled 2017-08-13 (×3): qty 1

## 2017-08-13 MED ORDER — MORPHINE SULFATE 2 MG/ML IV SOLN
INTRAVENOUS | Status: DC
  Administered 2017-08-13: 15 mg via INTRAVENOUS
  Administered 2017-08-14: 7.5 mg via INTRAVENOUS
  Administered 2017-08-14: 9 mg via INTRAVENOUS

## 2017-08-13 MED ORDER — DEXTROSE IN LACTATED RINGERS 5 % IV SOLN
INTRAVENOUS | Status: DC
  Administered 2017-08-13 – 2017-08-14 (×3): via INTRAVENOUS

## 2017-08-13 MED ORDER — MIDAZOLAM HCL 2 MG/2ML IJ SOLN
INTRAMUSCULAR | Status: DC | PRN
  Administered 2017-08-13: 2 mg via INTRAVENOUS

## 2017-08-13 MED ORDER — KETOROLAC TROMETHAMINE 30 MG/ML IJ SOLN
INTRAMUSCULAR | Status: DC | PRN
  Administered 2017-08-13: 30 mg via INTRAVENOUS

## 2017-08-13 MED ORDER — LIDOCAINE HCL (CARDIAC) 20 MG/ML IV SOLN
INTRAVENOUS | Status: AC
  Filled 2017-08-13: qty 5

## 2017-08-13 MED ORDER — FENTANYL CITRATE (PF) 250 MCG/5ML IJ SOLN
INTRAMUSCULAR | Status: AC
  Filled 2017-08-13: qty 5

## 2017-08-13 MED ORDER — LIDOCAINE HCL (CARDIAC) 20 MG/ML IV SOLN
INTRAVENOUS | Status: DC | PRN
  Administered 2017-08-13: 80 mg via INTRAVENOUS

## 2017-08-13 MED ORDER — ONDANSETRON HCL 4 MG/2ML IJ SOLN
4.0000 mg | Freq: Four times a day (QID) | INTRAMUSCULAR | Status: DC | PRN
  Administered 2017-08-13 – 2017-08-14 (×3): 4 mg via INTRAVENOUS
  Filled 2017-08-13 (×2): qty 2

## 2017-08-13 MED ORDER — DIPHENHYDRAMINE HCL 12.5 MG/5ML PO ELIX: 12.5000 mg | ORAL_SOLUTION | Freq: Four times a day (QID) | ORAL | Status: DC | PRN

## 2017-08-13 MED ORDER — AZELASTINE HCL 0.1 % NA SOLN
1.0000 | Freq: Two times a day (BID) | NASAL | Status: DC
  Filled 2017-08-13: qty 30

## 2017-08-13 MED ORDER — ONDANSETRON HCL 4 MG/2ML IJ SOLN
INTRAMUSCULAR | Status: DC | PRN
  Administered 2017-08-13: 4 mg via INTRAVENOUS

## 2017-08-13 MED ORDER — SODIUM CHLORIDE 0.9 % IJ SOLN
INTRAMUSCULAR | Status: DC | PRN
  Administered 2017-08-13: 30 mL

## 2017-08-13 MED ORDER — PROPOFOL 10 MG/ML IV BOLUS
INTRAVENOUS | Status: AC
  Filled 2017-08-13: qty 20

## 2017-08-13 MED ORDER — FENTANYL CITRATE (PF) 100 MCG/2ML IJ SOLN
INTRAMUSCULAR | Status: DC | PRN
  Administered 2017-08-13 (×3): 50 ug via INTRAVENOUS
  Administered 2017-08-13 (×2): 100 ug via INTRAVENOUS

## 2017-08-13 MED ORDER — MIDAZOLAM HCL 2 MG/2ML IJ SOLN
INTRAMUSCULAR | Status: AC
  Filled 2017-08-13: qty 2

## 2017-08-13 MED ORDER — DEXAMETHASONE SODIUM PHOSPHATE 4 MG/ML IJ SOLN
INTRAMUSCULAR | Status: AC
  Filled 2017-08-13: qty 1

## 2017-08-13 MED ORDER — IBUPROFEN 800 MG PO TABS
800.0000 mg | ORAL_TABLET | Freq: Three times a day (TID) | ORAL | Status: DC | PRN
  Administered 2017-08-14 – 2017-08-15 (×3): 800 mg via ORAL
  Filled 2017-08-13 (×3): qty 1

## 2017-08-13 MED ORDER — HYDROMORPHONE HCL 1 MG/ML IJ SOLN
0.2500 mg | INTRAMUSCULAR | Status: DC | PRN
  Administered 2017-08-13 (×4): 0.5 mg via INTRAVENOUS

## 2017-08-13 MED ORDER — SUGAMMADEX SODIUM 200 MG/2ML IV SOLN
INTRAVENOUS | Status: AC
  Filled 2017-08-13: qty 2

## 2017-08-13 MED ORDER — SUGAMMADEX SODIUM 200 MG/2ML IV SOLN
INTRAVENOUS | Status: DC | PRN
  Administered 2017-08-13: 50 mg via INTRAVENOUS
  Administered 2017-08-13: 150 mg via INTRAVENOUS

## 2017-08-13 MED ORDER — MENTHOL 3 MG MT LOZG: 1.0000 | LOZENGE | OROMUCOSAL | Status: DC | PRN

## 2017-08-13 MED ORDER — NALOXONE HCL 0.4 MG/ML IJ SOLN: 0.4000 mg | INTRAMUSCULAR | Status: DC | PRN

## 2017-08-13 MED ORDER — CEFOTETAN DISODIUM-DEXTROSE 2-2.08 GM-%(50ML) IV SOLR
2.0000 g | INTRAVENOUS | Status: AC
  Administered 2017-08-13: 2 g via INTRAVENOUS

## 2017-08-13 MED ORDER — BUTORPHANOL TARTRATE 1 MG/ML IJ SOLN: 1.0000 mg | INTRAMUSCULAR | Status: DC | PRN

## 2017-08-13 MED ORDER — SODIUM CHLORIDE 0.9% FLUSH: 9.0000 mL | INTRAVENOUS | Status: DC | PRN

## 2017-08-13 MED ORDER — ONDANSETRON HCL 4 MG/2ML IJ SOLN
INTRAMUSCULAR | Status: AC
  Filled 2017-08-13: qty 2

## 2017-08-13 MED ORDER — MONTELUKAST SODIUM 10 MG PO TABS
10.0000 mg | ORAL_TABLET | Freq: Every day | ORAL | Status: DC
  Administered 2017-08-13: 10 mg via ORAL
  Filled 2017-08-13 (×2): qty 1

## 2017-08-13 MED ORDER — SCOPOLAMINE 1 MG/3DAYS TD PT72
1.0000 | MEDICATED_PATCH | Freq: Once | TRANSDERMAL | Status: DC
  Administered 2017-08-13: 1.5 mg via TRANSDERMAL

## 2017-08-13 MED ORDER — BUPIVACAINE HCL (PF) 0.25 % IJ SOLN
INTRAMUSCULAR | Status: AC
  Filled 2017-08-13: qty 30

## 2017-08-13 MED ORDER — KETOROLAC TROMETHAMINE 30 MG/ML IJ SOLN: 30.0000 mg | Freq: Four times a day (QID) | INTRAMUSCULAR | Status: DC

## 2017-08-13 MED ORDER — DEXAMETHASONE SODIUM PHOSPHATE 10 MG/ML IJ SOLN
INTRAMUSCULAR | Status: AC
  Filled 2017-08-13: qty 1

## 2017-08-13 MED ORDER — ONDANSETRON HCL 4 MG PO TABS: 4.0000 mg | ORAL_TABLET | Freq: Four times a day (QID) | ORAL | Status: DC | PRN

## 2017-08-13 MED ORDER — BUPIVACAINE HCL 0.25 % IJ SOLN
INTRAMUSCULAR | Status: DC | PRN
  Administered 2017-08-13: 30 mL

## 2017-08-13 MED ORDER — ONDANSETRON HCL 4 MG/2ML IJ SOLN: 4.0000 mg | Freq: Four times a day (QID) | INTRAMUSCULAR | Status: DC | PRN

## 2017-08-13 MED ORDER — DIPHENHYDRAMINE HCL 50 MG/ML IJ SOLN
12.5000 mg | Freq: Four times a day (QID) | INTRAMUSCULAR | Status: DC | PRN
Start: 2017-08-13 — End: 2017-08-15

## 2017-08-13 MED ORDER — CEFOTETAN DISODIUM-DEXTROSE 2-2.08 GM-%(50ML) IV SOLR
INTRAVENOUS | Status: AC
  Filled 2017-08-13: qty 50

## 2017-08-13 MED ORDER — ROCURONIUM BROMIDE 100 MG/10ML IV SOLN
INTRAVENOUS | Status: DC | PRN
  Administered 2017-08-13: 50 mg via INTRAVENOUS

## 2017-08-13 MED ORDER — KETOROLAC TROMETHAMINE 30 MG/ML IJ SOLN
30.0000 mg | Freq: Once | INTRAMUSCULAR | Status: AC
  Administered 2017-08-13: 30 mg via INTRAVENOUS
  Filled 2017-08-13: qty 1

## 2017-08-13 MED ORDER — PANTOPRAZOLE SODIUM 40 MG PO TBEC: 40.0000 mg | DELAYED_RELEASE_TABLET | Freq: Every day | ORAL | Status: DC

## 2017-08-13 MED ORDER — KETOROLAC TROMETHAMINE 30 MG/ML IJ SOLN
INTRAMUSCULAR | Status: AC
  Filled 2017-08-13: qty 1

## 2017-08-13 MED ORDER — MORPHINE SULFATE 2 MG/ML IV SOLN
INTRAVENOUS | Status: DC
  Administered 2017-08-13: 11:00:00 via INTRAVENOUS
  Filled 2017-08-13: qty 30

## 2017-08-13 MED ORDER — DIPHENHYDRAMINE HCL 50 MG/ML IJ SOLN: 12.5000 mg | Freq: Four times a day (QID) | INTRAMUSCULAR | Status: DC | PRN

## 2017-08-13 MED ORDER — SCOPOLAMINE 1 MG/3DAYS TD PT72
MEDICATED_PATCH | TRANSDERMAL | Status: AC
  Administered 2017-08-13: 1.5 mg via TRANSDERMAL
  Filled 2017-08-13: qty 1

## 2017-08-13 MED ORDER — ONDANSETRON HCL 4 MG/2ML IJ SOLN
4.0000 mg | Freq: Four times a day (QID) | INTRAMUSCULAR | Status: DC | PRN
  Administered 2017-08-14: 4 mg via INTRAVENOUS
  Filled 2017-08-13 (×2): qty 2

## 2017-08-13 MED ORDER — PROPOFOL 10 MG/ML IV BOLUS
INTRAVENOUS | Status: DC | PRN
  Administered 2017-08-13: 160 mg via INTRAVENOUS

## 2017-08-13 MED ORDER — OXYCODONE-ACETAMINOPHEN 5-325 MG PO TABS
1.0000 | ORAL_TABLET | ORAL | Status: DC | PRN
  Administered 2017-08-14 (×3): 2 via ORAL
  Administered 2017-08-14 (×2): 1 via ORAL
  Administered 2017-08-15 (×2): 2 via ORAL
  Filled 2017-08-13 (×7): qty 2

## 2017-08-13 MED ORDER — SODIUM CHLORIDE 0.9 % IJ SOLN
INTRAMUSCULAR | Status: AC
  Filled 2017-08-13: qty 50

## 2017-08-13 MED ORDER — BUPIVACAINE LIPOSOME 1.3 % IJ SUSP
20.0000 mL | Freq: Once | INTRAMUSCULAR | Status: AC
  Administered 2017-08-13: 20 mL
  Filled 2017-08-13: qty 20

## 2017-08-13 MED ORDER — ROCURONIUM BROMIDE 100 MG/10ML IV SOLN
INTRAVENOUS | Status: AC
  Filled 2017-08-13: qty 1

## 2017-08-13 MED ORDER — LACTATED RINGERS IV SOLN
INTRAVENOUS | Status: DC
  Administered 2017-08-13: 08:00:00 via INTRAVENOUS
  Administered 2017-08-13: 125 mL/h via INTRAVENOUS

## 2017-08-13 MED ORDER — DEXAMETHASONE SODIUM PHOSPHATE 10 MG/ML IJ SOLN
INTRAMUSCULAR | Status: DC | PRN
  Administered 2017-08-13: 10 mg via INTRAVENOUS

## 2017-08-13 MED ORDER — FENTANYL CITRATE (PF) 100 MCG/2ML IJ SOLN
INTRAMUSCULAR | Status: AC
  Filled 2017-08-13: qty 2

## 2017-08-13 SURGICAL SUPPLY — 44 items
APL SKNCLS STERI-STRIP NONHPOA (GAUZE/BANDAGES/DRESSINGS)
BARRIER ADHS 3X4 INTERCEED (GAUZE/BANDAGES/DRESSINGS) IMPLANT
BENZOIN TINCTURE PRP APPL 2/3 (GAUZE/BANDAGES/DRESSINGS) IMPLANT
BRR ADH 4X3 ABS CNTRL BYND (GAUZE/BANDAGES/DRESSINGS)
CANISTER SUCT 3000ML PPV (MISCELLANEOUS) ×4 IMPLANT
CLOTH BEACON ORANGE TIMEOUT ST (SAFETY) ×4 IMPLANT
CONT PATH 16OZ SNAP LID 3702 (MISCELLANEOUS) ×4 IMPLANT
DECANTER SPIKE VIAL GLASS SM (MISCELLANEOUS) ×1 IMPLANT
DRAPE CESAREAN BIRTH W POUCH (DRAPES) ×4 IMPLANT
DRAPE WARM FLUID 44X44 (DRAPE) ×4 IMPLANT
DRSG OPSITE POSTOP 4X10 (GAUZE/BANDAGES/DRESSINGS) ×4 IMPLANT
DURAPREP 26ML APPLICATOR (WOUND CARE) ×4 IMPLANT
GAUZE SPONGE 4X4 16PLY XRAY LF (GAUZE/BANDAGES/DRESSINGS) ×2 IMPLANT
GLOVE BIO SURGEON STRL SZ7 (GLOVE) ×4 IMPLANT
GLOVE BIOGEL PI IND STRL 7.0 (GLOVE) ×6 IMPLANT
GLOVE BIOGEL PI INDICATOR 7.0 (GLOVE) ×2
GOWN STRL REUS W/TWL LRG LVL3 (GOWN DISPOSABLE) ×12 IMPLANT
NDL HYPO 18GX1.5 BLUNT FILL (NEEDLE) IMPLANT
NDL SPNL 22GX3.5 QUINCKE BK (NEEDLE) ×3 IMPLANT
NEEDLE HYPO 18GX1.5 BLUNT FILL (NEEDLE) ×4 IMPLANT
NEEDLE SPNL 22GX3.5 QUINCKE BK (NEEDLE) ×4 IMPLANT
NS IRRIG 1000ML POUR BTL (IV SOLUTION) ×5 IMPLANT
PACK ABDOMINAL GYN (CUSTOM PROCEDURE TRAY) ×4 IMPLANT
PAD OB MATERNITY 4.3X12.25 (PERSONAL CARE ITEMS) ×4 IMPLANT
PROTECTOR NERVE ULNAR (MISCELLANEOUS) ×4 IMPLANT
RTRCTR C-SECT PINK 25CM LRG (MISCELLANEOUS) ×3 IMPLANT
SPONGE LAP 18X18 X RAY DECT (DISPOSABLE) ×2 IMPLANT
STRIP CLOSURE SKIN 1/2X4 (GAUZE/BANDAGES/DRESSINGS) IMPLANT
SUT CHROMIC 3 0 SH 27 (SUTURE) IMPLANT
SUT MON AB 2-0 CT1 36 (SUTURE) ×1 IMPLANT
SUT MON AB 4-0 PS1 27 (SUTURE) ×4 IMPLANT
SUT PDS AB 0 CT1 27 (SUTURE) ×8 IMPLANT
SUT VIC AB 0 CT1 18XCR BRD8 (SUTURE) ×6 IMPLANT
SUT VIC AB 0 CT1 27 (SUTURE) ×4
SUT VIC AB 0 CT1 27XBRD ANBCTR (SUTURE) ×3 IMPLANT
SUT VIC AB 0 CT1 8-18 (SUTURE) ×8
SUT VIC AB 2-0 CT1 27 (SUTURE)
SUT VIC AB 2-0 CT1 TAPERPNT 27 (SUTURE) IMPLANT
SUT VIC AB 3-0 CT1 27 (SUTURE) ×4
SUT VIC AB 3-0 CT1 TAPERPNT 27 (SUTURE) ×3 IMPLANT
SUT VICRYL 0 TIES 12 18 (SUTURE) ×4 IMPLANT
SYR 20CC LL (SYRINGE) ×4 IMPLANT
TOWEL OR 17X24 6PK STRL BLUE (TOWEL DISPOSABLE) ×8 IMPLANT
TRAY FOLEY CATH SILVER 14FR (SET/KITS/TRAYS/PACK) ×4 IMPLANT

## 2017-08-13 NOTE — Anesthesia Postprocedure Evaluation (Signed)
Anesthesia Post Note  Patient: Abigail Love  Procedure(s) Performed: HYSTERECTOMY ABDOMINAL (N/A Abdomen) BILATERAL SALPINGECTOMY (Bilateral Abdomen) OOPHORECTOMY (Right Abdomen)     Patient location during evaluation: PACU Anesthesia Type: General Level of consciousness: awake and alert Pain management: pain level controlled Vital Signs Assessment: post-procedure vital signs reviewed and stable Respiratory status: spontaneous breathing, nonlabored ventilation, respiratory function stable and patient connected to nasal cannula oxygen Cardiovascular status: blood pressure returned to baseline and stable Postop Assessment: no apparent nausea or vomiting Anesthetic complications: no    Last Vitals:  Vitals:   08/13/17 0930 08/13/17 0945  BP: (!) 148/83 (!) 153/85  Pulse: 73 83  Resp: 15 14  Temp:    SpO2: 96% 95%    Last Pain:  Vitals:   08/13/17 0945  TempSrc:   PainSc: Asleep   Pain Goal: Patients Stated Pain Goal: 3 (08/13/17 7622)               Jefm Petty. EDMOND

## 2017-08-13 NOTE — Progress Notes (Signed)
The patient was re-examined with no change in status 

## 2017-08-13 NOTE — Transfer of Care (Signed)
Immediate Anesthesia Transfer of Care Note  Patient: Abigail Love  Procedure(s) Performed: HYSTERECTOMY ABDOMINAL (N/A Abdomen) BILATERAL SALPINGECTOMY (Bilateral Abdomen) OOPHORECTOMY (Right Abdomen)  Patient Location: PACU  Anesthesia Type:General  Level of Consciousness: sedated  Airway & Oxygen Therapy: Patient Spontanous Breathing and Patient connected to nasal cannula oxygen  Post-op Assessment: Post -op Vital signs reviewed and stable  Post vital signs: Reviewed and stable  Last Vitals:  Vitals:   08/13/17 0635  BP: (!) 160/86  Pulse: 97  Resp: 16  Temp: 37 C  SpO2: 100%    Last Pain:  Vitals:   08/13/17 0635  TempSrc: Oral      Patients Stated Pain Goal: 3 (72/82/06 0156)  Complications: No apparent anesthesia complications

## 2017-08-13 NOTE — Addendum Note (Signed)
Addendum  created 08/13/17 1427 by Asher Muir, CRNA   Sign clinical note

## 2017-08-13 NOTE — Anesthesia Procedure Notes (Addendum)
Procedure Name: Intubation Date/Time: 08/13/2017 7:27 AM Performed by: Asher Muir, CRNA Pre-anesthesia Checklist: Patient identified, Emergency Drugs available, Suction available and Patient being monitored Patient Re-evaluated:Patient Re-evaluated prior to induction Oxygen Delivery Method: Circle system utilized and Simple face mask Preoxygenation: Pre-oxygenation with 100% oxygen Induction Type: IV induction Ventilation: Mask ventilation without difficulty Laryngoscope Size: Mac and 3 Grade View: Grade I Tube type: Oral Tube size: 7.0 mm Number of attempts: 1 Airway Equipment and Method: Stylet Placement Confirmation: ETT inserted through vocal cords under direct vision,  positive ETCO2 and breath sounds checked- equal and bilateral Secured at: 20 (right lip) cm Tube secured with: Tape Dental Injury: Teeth and Oropharynx as per pre-operative assessment

## 2017-08-13 NOTE — Op Note (Signed)
Preoperative diagnosis: Pelvic pain, menorrhagia, leiomyoma  Postoperative diagnosis: Same  Procedure: TAH bilateral salpingectomy, RSO  Surgeon: Bonnita Nasuti  Assistant: [place]  Anesthesia: General  EBL: 150 cc  Procedure and findings:  The patient was taken to the operating room after an adequate level of general anesthesia was obtained with the patient supine position the abdomen prepped and draped in the vagina was prepped separately and a Foley catheter with position.  Appropriate timeout was taken at that point.  Transverse incision made 2 fingerbreadths above the symphysis carried down to the fascia which was incised and extended transversely.  Rectus muscle divided in the midline, peritoneum entered superiorly without incident and extended in a vertical fashion.  Uterus was enlarged approximately 14 weeks size with irregular fibroids was mobile however and was brought to the incision without a retractor.  Starting on the left the round ligament was clamped divided and suture ligated, long Kelly clamps were placed at each utero-ovarian pedicle for hemostasis and traction.  Peritoneum carried around to the midline, same repeated on the opposite side and the bladder flap was developed and the bladder dissected below the cervicovaginal junction.  The utero-ovarian pedicle on the left was clamped about a first free tied followed by suture ligature of 0 Vicryl thus the left ovary was conserved which appeared to be normal left salpingectomy performed by clamp cut tie technique to excise the fallopian tube on the left.  The exact same repeated on the right initially conserving both ovaries.  The uterine vasculature on each side was then skeletonized clamped divided and suture ligated with 0 Vicryl in sequential manner the cardinal ligament uterosacral ligament and cervicovaginal pedicles were clamped divided and suture ligated on each side until the specimen was excised.  The vaginal cuff closed with  figure-of-eight 2-0 Vicryl sutures.  Pelvis was irrigated with saline and aspirated noted to be hemostatic the left ovary was suspended to the left round ligament when observing the right ovary there was some bleeding just below the ovary, decision made to proceed with RSO, the course of the ureter was well below the IP ligament was clamped divided first free tie followed by suture ligature of 0 Vicryl this area was inspected and noted be hemostatic prior to closure sponge, needle counts were counts reported as correct x2 peritoneum closed with a running 3-0 Vicryl suture of the same on the rectus muscles in the midline 0 PDS was then used to close the fascia transversely diluted Exparel solution was then used to inject subfascially and subcutaneously for postoperative analgesia.  Subcu area was irrigated and noted to be hemostatic dead space reapproximated with 3-0 Vicryl interrupted sutures.  4-0 Monocryl subcuticular skin closure and a honeycomb dressing.  She tolerated this well went to recovery room in good condition.  Dictated with [employer]  Margarette Asal MD

## 2017-08-13 NOTE — Anesthesia Postprocedure Evaluation (Signed)
Anesthesia Post Note  Patient: Abigail Love  Procedure(s) Performed: HYSTERECTOMY ABDOMINAL (N/A Abdomen) BILATERAL SALPINGECTOMY (Bilateral Abdomen) OOPHORECTOMY (Right Abdomen)     Patient location during evaluation: [facility] Anesthesia Type: General Level of consciousness: awake Pain management: satisfactory to patient Vital Signs Assessment: post-procedure vital signs reviewed and stable Respiratory status: spontaneous breathing Cardiovascular status: stable Anesthetic complications: no    Last Vitals:  Vitals:   08/13/17 1335 08/13/17 1356  BP: (!) 135/57   Pulse: 66   Resp: 13   Temp: 36.9 C   SpO2: 99% 99%    Last Pain:  Vitals:   08/13/17 1356  TempSrc:   PainSc: 5    Pain Goal: Patients Stated Pain Goal: 3 (08/13/17 1034)               Casimer Lanius

## 2017-08-14 ENCOUNTER — Other Ambulatory Visit: Payer: Self-pay

## 2017-08-14 ENCOUNTER — Encounter (HOSPITAL_COMMUNITY): Payer: Self-pay | Admitting: Obstetrics and Gynecology

## 2017-08-14 LAB — CBC
HCT: 29.8 % — ABNORMAL LOW (ref 36.0–46.0)
Hemoglobin: 9.4 g/dL — ABNORMAL LOW (ref 12.0–15.0)
MCH: 23.7 pg — ABNORMAL LOW (ref 26.0–34.0)
MCHC: 31.5 g/dL (ref 30.0–36.0)
MCV: 75.3 fL — AB (ref 78.0–100.0)
PLATELETS: 310 10*3/uL (ref 150–400)
RBC: 3.96 MIL/uL (ref 3.87–5.11)
RDW: 15.9 % — AB (ref 11.5–15.5)
WBC: 9.1 10*3/uL (ref 4.0–10.5)

## 2017-08-14 MED ORDER — GUAIFENESIN-CODEINE 100-10 MG/5ML PO SOLN
10.0000 mL | ORAL | Status: DC | PRN
  Administered 2017-08-14 – 2017-08-15 (×3): 10 mL via ORAL
  Filled 2017-08-14 (×4): qty 10

## 2017-08-14 MED ORDER — GUAIFENESIN-CODEINE 100-10 MG/5ML PO SOLN
5.0000 mL | ORAL | Status: DC | PRN
  Filled 2017-08-14: qty 5

## 2017-08-14 MED ORDER — GUAIFENESIN-CODEINE 100-10 MG/5ML PO SOLN
5.0000 mL | Freq: Once | ORAL | Status: DC
  Filled 2017-08-14: qty 5

## 2017-08-14 NOTE — Progress Notes (Signed)
1 Day Post-Op Procedure(s) (LRB): HYSTERECTOMY ABDOMINAL (N/A) BILATERAL SALPINGECTOMY (Bilateral) OOPHORECTOMY (Right)  Subjective: Patient reports nausea.    Objective: I have reviewed patient's vital signs, medications and labs.  abd soft + BS, dressing dry CBC    Component Value Date/Time   WBC 9.1 08/14/2017 0514   RBC 3.96 08/14/2017 0514   HGB 9.4 (L) 08/14/2017 0514   HCT 29.8 (L) 08/14/2017 0514   PLT 310 08/14/2017 0514   MCV 75.3 (L) 08/14/2017 0514   MCH 23.7 (L) 08/14/2017 0514   MCHC 31.5 08/14/2017 0514   RDW 15.9 (H) 08/14/2017 0514    Assessment: s/p Procedure(s): HYSTERECTOMY ABDOMINAL (N/A) BILATERAL SALPINGECTOMY (Bilateral) OOPHORECTOMY (Right): stable  Plan: Advance diet Encourage ambulation Advance to PO medication Discontinue IV fluids  LOS: 1 day    Markea Ruzich M 08/14/2017, 8:40 AM

## 2017-08-15 MED ORDER — GUAIFENESIN-CODEINE 100-10 MG/5ML PO SOLN: 5.0000 mL | Freq: Four times a day (QID) | ORAL | 0 refills | Status: AC | PRN

## 2017-08-15 MED ORDER — OXYCODONE-ACETAMINOPHEN 5-325 MG PO TABS
1.0000 | ORAL_TABLET | ORAL | 0 refills | Status: AC | PRN
Start: 2017-08-15 — End: ?

## 2017-08-15 MED ORDER — IBUPROFEN 800 MG PO TABS: 800.0000 mg | ORAL_TABLET | Freq: Three times a day (TID) | ORAL | 1 refills | Status: AC | PRN

## 2017-08-15 MED ORDER — FERROUS SULFATE 325 (65 FE) MG PO TABS: 325.0000 mg | ORAL_TABLET | Freq: Every day | ORAL | 1 refills | Status: AC

## 2017-08-15 NOTE — Progress Notes (Signed)
Discharge teaching complete. Pt understood all information and did not have any questions.

## 2017-08-15 NOTE — Discharge Summary (Signed)
Physician Discharge Summary  Patient ID: Abigail Love MRN: [id number] DOB/AGE: 54-Jan-1962 54 y.o.  Admit date: 08/13/2017 Discharge date: 08/15/2017  Admission [place], [place] [facility]  Discharge Diagnoses: SAME Active Problems:   Leiomyoma   Discharged Condition: good  Hospital Course: ADM FOR TAH, BS, on POD @ ready for D/C with pt afeb, ambulating and tol PO  Consults: None  Significant Diagnostic Studies: labs:  CBC    Component Value Date/Time   WBC 9.1 08/14/2017 0514   RBC 3.96 08/14/2017 0514   HGB 9.4 (L) 08/14/2017 0514   HCT 29.8 (L) 08/14/2017 0514   PLT 310 08/14/2017 0514   MCV 75.3 (L) 08/14/2017 0514   MCH 23.7 (L) 08/14/2017 0514   MCHC 31.5 08/14/2017 0514   RDW 15.9 (H) 08/14/2017 0514     Treatments: surgery: TAH, RSO, L salpingectomy  Discharge Exam: Blood pressure (!) 119/56, pulse 69, temperature 99.1 F (37.3 C), temperature source Oral, resp. rate 18, height 5\' 7"  (1.702 m), weight 178 lb (80.7 kg), last menstrual period 07/25/2017, SpO2 96 %. abd soft + BS, dressing dry  Disposition: 01-Home or Self Care   Allergies as of 08/15/2017   No Known Allergies     Medication List    STOP taking these medications   azelastine 0.1 % nasal spray Commonly known as:  ASTELIN   benzonatate 100 MG capsule Commonly known as:  TESSALON   GILDESS FE 1/20 1-20 MG-MCG tablet Generic drug:  norethindrone-ethinyl estradiol   montelukast 10 MG tablet Commonly known as:  SINGULAIR   ranitidine 150 MG tablet Commonly known as:  ZANTAC     TAKE these medications   cetirizine 10 MG tablet Commonly known as:  ZYRTEC Take 10 mg by mouth at bedtime.   guaiFENesin-codeine 100-10 MG/5ML syrup Take 5 mLs every 6 (six) hours as needed by mouth for cough.   ibuprofen 800 MG tablet Commonly known as:  ADVIL,MOTRIN Take 1 tablet (800 mg total) every 8 (eight) hours as needed by mouth (mild pain). What changed:    medication  strength  when to take this  reasons to take this   omeprazole 20 MG capsule Commonly known as:  PRILOSEC Take 20 mg by mouth daily.   oxyCODONE-acetaminophen 5-325 MG tablet Commonly known as:  PERCOCET/ROXICET Take 1-2 tablets every 4 (four) hours as needed by mouth for severe pain (moderate to severe pain (when tolerating fluids)). What changed:    how much to take  when to take this  reasons to take this      Follow-up Information    Molli Posey, MD. Schedule an appointment as soon as possible for a visit.   Specialty:  Obstetrics and Gynecology Contact information: [address] [place] [address] [phone]           Signed: Margarette Asal 08/15/2017, 8:45 AM

## 2017-09-18 ENCOUNTER — Encounter: Payer: Self-pay | Admitting: Pulmonary Disease

## 2017-09-18 ENCOUNTER — Ambulatory Visit (INDEPENDENT_AMBULATORY_CARE_PROVIDER_SITE_OTHER): Payer: 59 | Admitting: Pulmonary Disease

## 2017-09-18 VITALS — BP 128/84 | HR 101 | Ht 67.0 in | Wt 176.0 lb

## 2017-09-18 DIAGNOSIS — R05 Cough: Secondary | ICD-10-CM | POA: Diagnosis not present

## 2017-09-18 DIAGNOSIS — R058 Other specified cough: Secondary | ICD-10-CM

## 2017-09-18 DIAGNOSIS — K219 Gastro-esophageal reflux disease without esophagitis: Secondary | ICD-10-CM | POA: Diagnosis not present

## 2017-09-18 DIAGNOSIS — J301 Allergic rhinitis due to pollen: Secondary | ICD-10-CM

## 2017-09-18 NOTE — Progress Notes (Signed)
Subjective:    Patient ID: Abigail Love, female    DOB: May 30, 1961, 56 y.o.   MRN: [id number]  Synopsis: Former patient of Dr. Melvyn Love who came back in October 2018 for evaluation of upper airway cough syndrome. Dr. Melvyn Love described her situation as follows: - Allergy eval  2013 neg Bardelas - cyclical cough regimen 05/25/538 > repeat over labor day weekend planned 2015  - Sinus CT  06/04/2014 > Normal paranasal sinuses - Neurontin rx 06/23/2014 > no better on 100 tid 08/03/14 so increased to 300 tid - Methacholine challenge testing  Aug 19 2014 > neg for asthma / neg for cough provocation   HPI Chief Complaint  Patient presents with  . Follow-up    chronic cough, surgery 5 weeks ago, hysterectomy, the medications she was on masked the cough    Her narcotic medications used around the time of surgery worked really well.  She says that the tylenol with codeine helped a lot.  She says that she has a "minor" cough that perhaps is in the first thing in the morning.   She stopped taking "everything" a week ago when she had some vertigo after surgery.  She has not started back on her PPI treatment.    Past Medical History:  Diagnosis Date  . Allergic rhinitis   . Anemia   . Chronic cough    followed by Dr Abigail Love  . Cough   . Fatigue   . Fibroids   . Palpitations 2018   Monitor worn and echo done - all normal  . SVD (spontaneous vaginal delivery)    x 3  . Tachycardia    Monitor worn and echo done - all normal       Review of Systems  Constitutional: Negative for fever and unexpected weight change.  HENT: Positive for congestion. Negative for dental problem, ear pain, nosebleeds, postnasal drip, rhinorrhea, sinus pressure, sneezing, sore throat and trouble swallowing.   Eyes: Negative for redness and itching.  Respiratory: Positive for cough. Negative for chest tightness, shortness of breath and wheezing.   Cardiovascular: Negative for palpitations and leg swelling.    Gastrointestinal: Negative for nausea and vomiting.  Genitourinary: Negative for dysuria.  Musculoskeletal: Negative for joint swelling.  Skin: Negative for rash.  Neurological: Negative for headaches.  Hematological: Does not bruise/bleed easily.  Psychiatric/Behavioral: Negative for dysphoric mood. The patient is not nervous/anxious.        Objective:   Physical Exam Vitals:   09/18/17 0913  BP: 128/84  Pulse: (!) 101  SpO2: 99%  Weight: 176 lb (79.8 kg)  Height: 5\' 7"  (1.702 m)    Gen: well appearing HENT: OP clear, TM's clear, neck supple PULM: CTA B, normal percussion CV: RRR, no mgr, trace edema GI: BS+, soft, nontender Derm: no cyanosis or rash Psyche: normal mood and affect    CBC    Component Value Date/Time   WBC 9.1 08/14/2017 0514   RBC 3.96 08/14/2017 0514   HGB 9.4 (L) 08/14/2017 0514   HCT 29.8 (L) 08/14/2017 0514   PLT 310 08/14/2017 0514   MCV 75.3 (L) 08/14/2017 0514   MCH 23.7 (L) 08/14/2017 0514   MCHC 31.5 08/14/2017 0514   RDW 15.9 (H) 08/14/2017 0514    Records from her visit with asthma and allergy reviewed, they recommended that she consider immunotherapy  Records from her visit with Abigail Love in the [facility] voice center reviewed. She was last seen there in 2016. He had recommended  an esophageal pre-H probe to assess for gastroesophageal reflux disease, the patient declined this test. He also recommended laryngoscopy which she also declined. He was treating her postnasal drip with Astelin and saline rinses and reflux with Pepcid.  Other imaging: - Sinus CT  06/04/2014 > Normal paranasal sinuses - Neurontin rx 06/23/2014 > no better on 100 tid 08/03/14 so increased to 300 tid  PFT: - Methacholine challenge testing  Aug 19 2014 > neg for asthma / neg for cough provocation      Assessment & Plan:   Seasonal allergic rhinitis due to pollen  Upper airway cough syndrome  Gastroesophageal reflux disease, esophagitis presence not  specified  Discussion: Abigail Love had a temporary relief in her cough when she was using narcotics.  This speaks to the fact that she has a heightened sensitivity from irritation around her larynx.  This comes from allergic rhinitis and gastroesophageal reflux disease.  Of note, recently she has been noncompliant with treatment for both of these conditions.  I explained her that she needs to take medicine for her allergic rhinitis and gastroesophageal reflux disease to keep them from causing her cough.  Compliance with the recommended regimen was stressed repeatedly today.  Plan: Allergic rhinitis: To maximize therapy for this short of immunotherapy I recommend the following: Add Singulair Use Astelin 2 puffs twice a day Continue Flonase 2 puffs daily If no improvement in follow-up with asthma and allergy and consider immunotherapy  Gastroesophageal reflux disease: Continue Prilosec in the morning and Pepcid at night If you continue to have trouble with cough and the sensation that there is mucus on your vocal cords and I recommend 24-hour pH and impedance probing with manometry  Cyclical cough, irritable larynx syndrome: Try your best to stop clearing her throat Use hard candies warm beverages to help soothe your throat rather than clearing her throat Focus on suppressing your cough   Current Outpatient Medications:  .  cetirizine (ZYRTEC) 10 MG tablet, Take 10 mg by mouth at bedtime., Disp: , Rfl:  .  ferrous sulfate (FERROUSUL) 325 (65 FE) MG tablet, Take 1 tablet (325 mg total) daily with breakfast by mouth., Disp: 30 tablet, Rfl: 1 .  guaiFENesin-codeine 100-10 MG/5ML syrup, Take 5 mLs every 6 (six) hours as needed by mouth for cough., Disp: 120 mL, Rfl: 0 .  ibuprofen (ADVIL,MOTRIN) 800 MG tablet, Take 1 tablet (800 mg total) every 8 (eight) hours as needed by mouth (mild pain)., Disp: 30 tablet, Rfl: 1 .  omeprazole (PRILOSEC) 20 MG capsule, Take 20 mg by mouth daily., Disp: , Rfl:   .  oxyCODONE-acetaminophen (PERCOCET/ROXICET) 5-325 MG tablet, Take 1-2 tablets every 4 (four) hours as needed by mouth for severe pain (moderate to severe pain (when tolerating fluids))., Disp: 30 tablet, Rfl: 0

## 2017-09-18 NOTE — Patient Instructions (Signed)
Allergic rhinitis: To maximize therapy for this short of immunotherapy I recommend the following: Add Singulair Use Astelin 2 puffs twice a day Continue Flonase 2 puffs daily If no improvement in follow-up with asthma and allergy and consider immunotherapy  Gastroesophageal reflux disease: Continue Prilosec in the morning and Pepcid at night If you continue to have trouble with cough and the sensation that there is mucus on your vocal cords and I recommend 24-hour pH and impedance probing with manometry  Cyclical cough, irritable larynx syndrome: Try your best to stop clearing her throat Use hard candies warm beverages to help soothe your throat rather than clearing her throat Focus on suppressing your cough

## 2017-11-04 ENCOUNTER — Other Ambulatory Visit: Payer: Self-pay | Admitting: Obstetrics and Gynecology

## 2017-11-04 ENCOUNTER — Ambulatory Visit
Admission: RE | Admit: 2017-11-04 | Discharge: 2017-11-04 | Disposition: A | Discharge status: Home / Self Care | Payer: 59 | Source: Ambulatory Visit | Attending: Obstetrics and Gynecology | Admitting: Obstetrics and Gynecology

## 2017-11-04 DIAGNOSIS — R928 Other abnormal and inconclusive findings on diagnostic imaging of breast: Secondary | ICD-10-CM

## 2017-11-04 IMAGING — MG 2D DIGITAL DIAGNOSTIC UNILATERAL LEFT MAMMOGRAM WITH CAD AND ADJ
9 of 12 series · 9 of 28 positions shown · non-contrast
Comparison: Previous exam(s).

CLINICAL DATA: Patient was called back from screening mammogram for
possible mass in the left breast.

EXAM:
2D DIGITAL DIAGNOSTIC LEFT MAMMOGRAM WITH CAD AND ADJUNCT TOMO
ULTRASOUND LEFT BREAST

[L CC (1 of 2)]
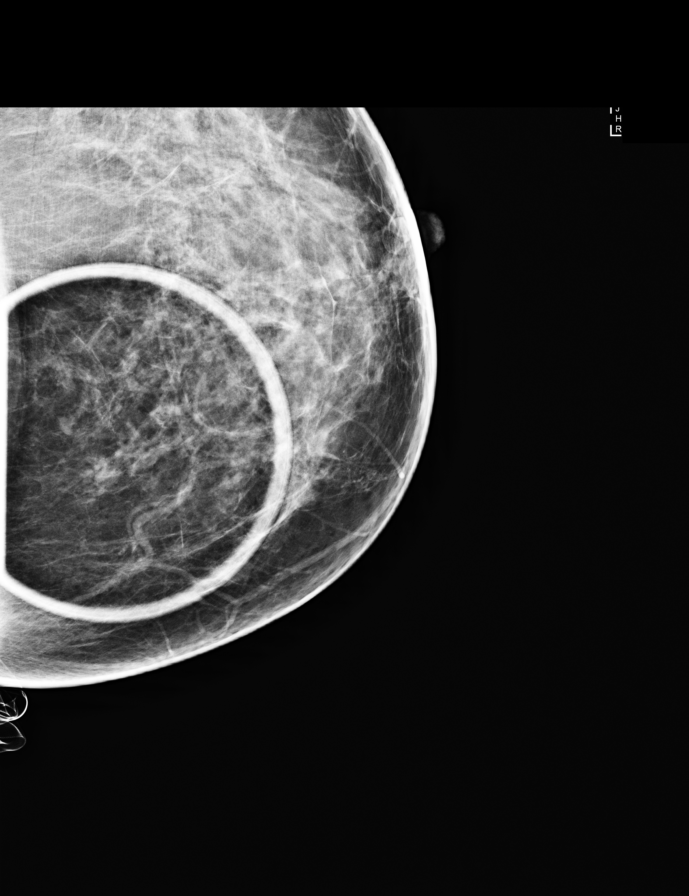

[L MLO]
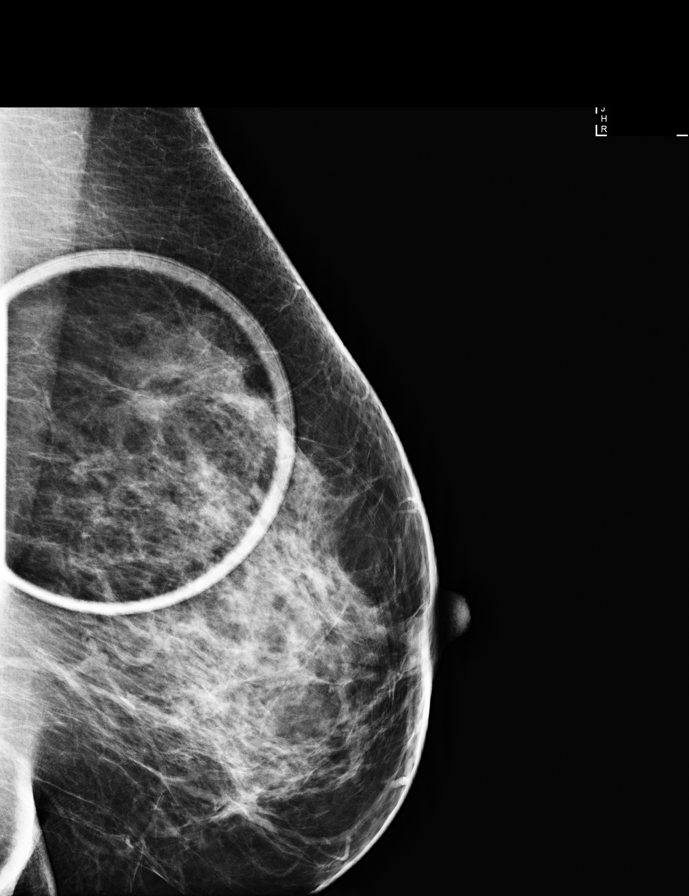

[L CC (2 of 2)]
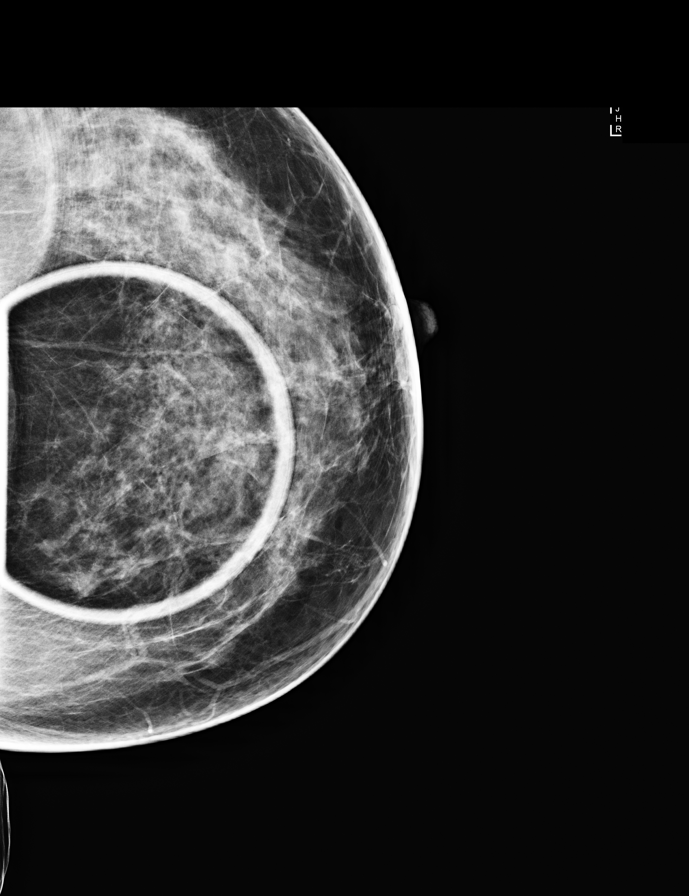

[L ML synth-2D]
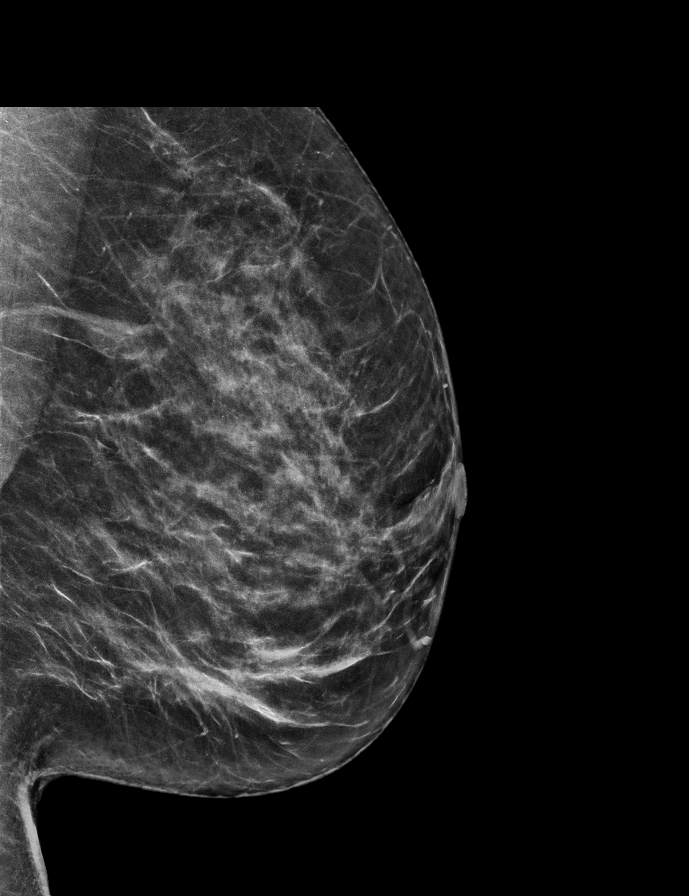

[L MLO synth-2D]
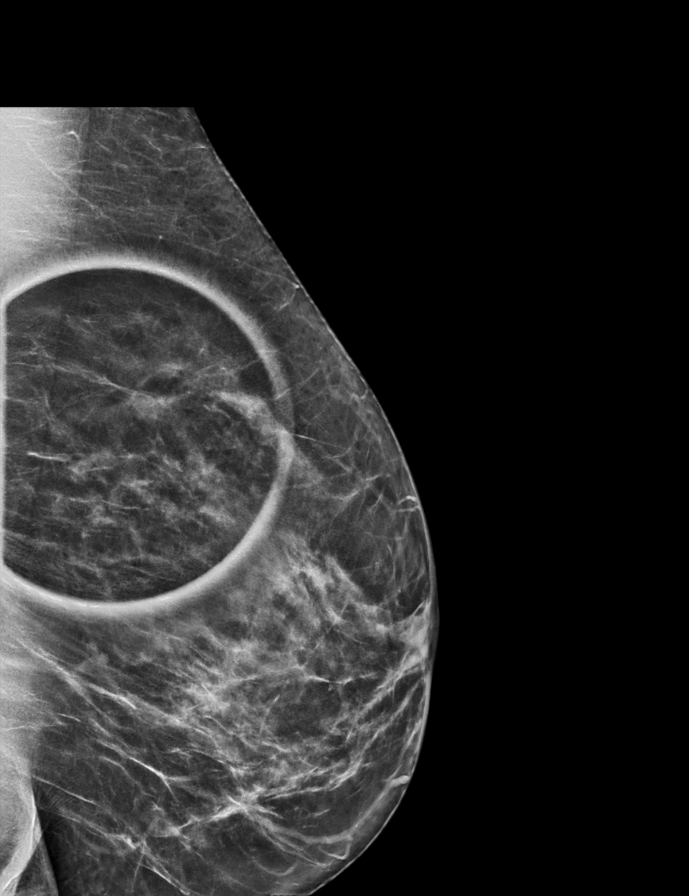

[L CC synth-2D (1 of 2)]
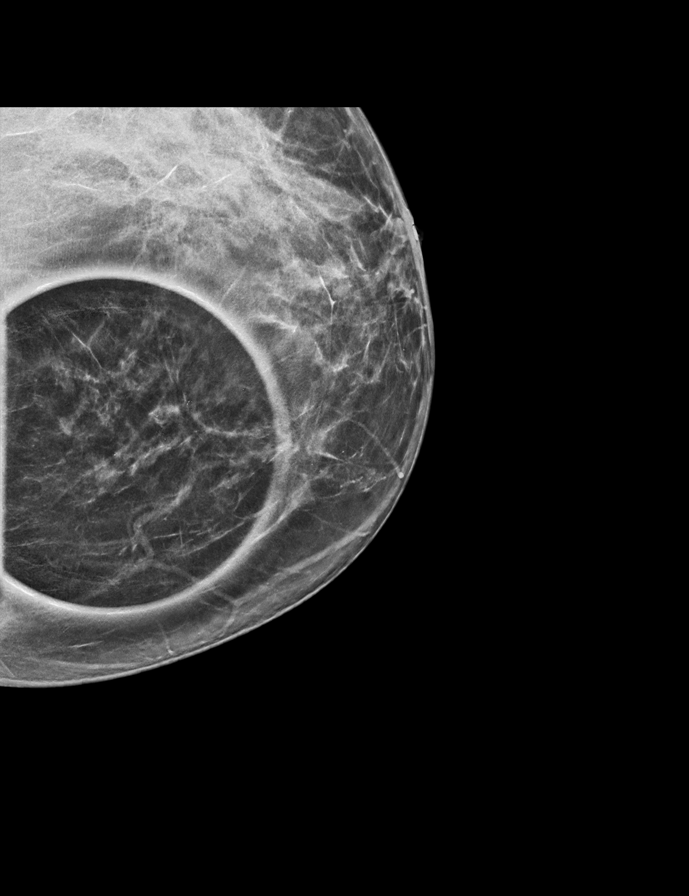

[L CC synth-2D (2 of 2)]
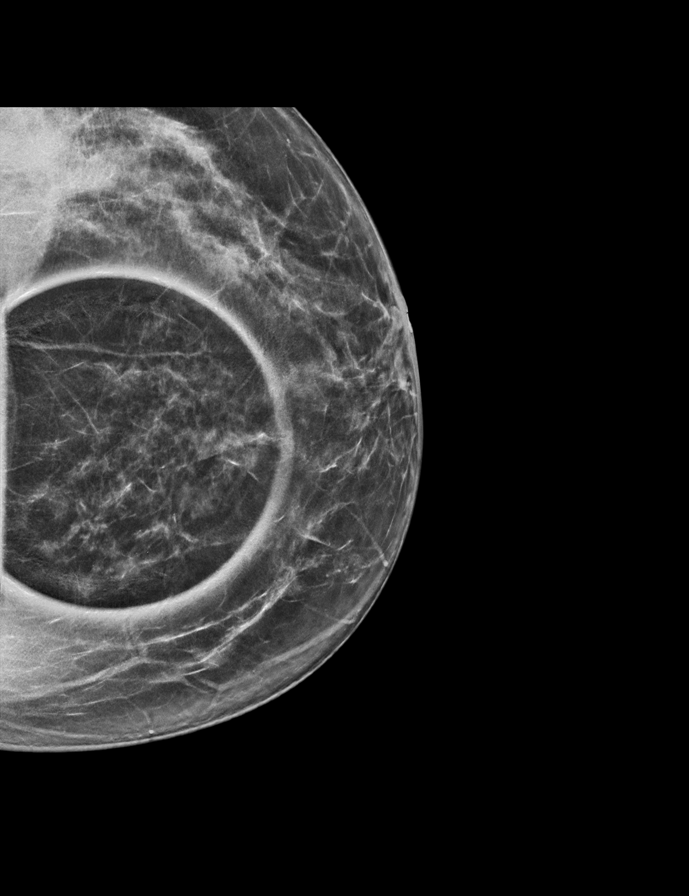

[L ML]
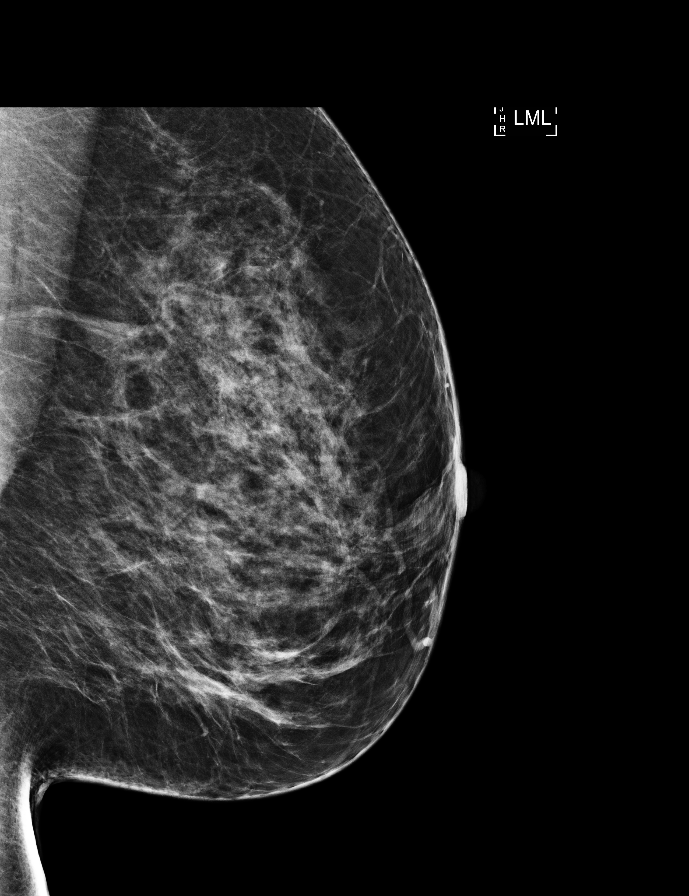

[L CC tomo · tomo slice 31/62.0]
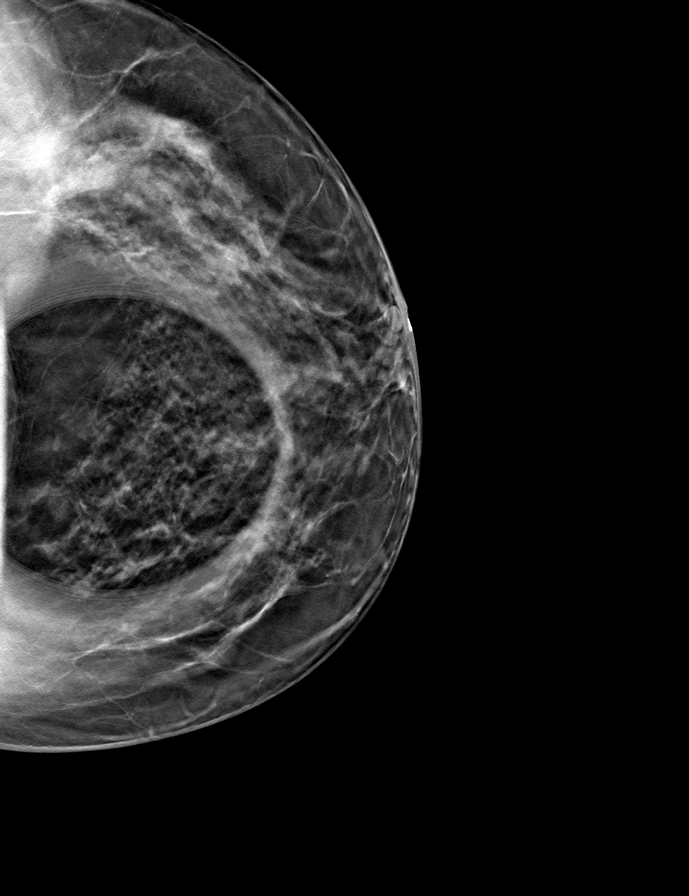

[9 of 28 positions shown; findings below may reference images not displayed]

ACR Breast Density Category c: The breast tissue is heterogeneously
dense, which may obscure small masses.
FINDINGS: Additional imaging of the left breast was performed. Parenchymal
pattern appears stable compared to prior exams dating back to
[DATE]. No suspicious mass or malignant type microcalcifications
identified.

Mammographic images were processed with CAD.

On physical exam, I do not palpate a mass in the upper-inner
quadrant of the left breast.

Targeted ultrasound is performed, showing normal tissue in the
upper-inner quadrant of the left breast. No solid or cystic mass,
abnormal shadowing or distortion visualized.
IMPRESSION: No evidence of malignancy in the left breast.

RECOMMENDATION:
Bilateral screening mammogram in 1 year is recommended.

I have discussed the findings and recommendations with the patient.
Results were also provided in writing at the conclusion of the
visit. If applicable, a reminder letter will be sent to the patient
regarding the next appointment.

BI-RADS CATEGORY  1: Negative.

## 2019-08-17 ENCOUNTER — Other Ambulatory Visit: Payer: Self-pay

## 2019-08-17 DIAGNOSIS — Z20822 Contact with and (suspected) exposure to covid-19: Secondary | ICD-10-CM

## 2019-08-19 LAB — NOVEL CORONAVIRUS, NAA: SARS-CoV-2, NAA: NOT DETECTED

## 2019-12-07 ENCOUNTER — Ambulatory Visit: Payer: Managed Care, Other (non HMO) | Attending: Internal Medicine

## 2019-12-07 DIAGNOSIS — Z23 Encounter for immunization: Secondary | ICD-10-CM

## 2019-12-07 NOTE — Progress Notes (Signed)
   Covid-19 Vaccination Clinic  Name:  Abigail Love    MRN: [id number] DOB: [birth year]  12/07/2019  [name] was observed post Covid-19 immunization for 15 minutes without incident. She was provided with Vaccine Information Sheet and instruction to access the V-Safe system.   [name] was instructed to call 911 with any severe reactions post vaccine: Marland Kitchen Difficulty breathing  . Swelling of face and throat  . A fast heartbeat  . A bad rash all over body  . Dizziness and weakness   Immunizations Administered    Name Date Dose VIS Date Route   Pfizer COVID-19 Vaccine 12/07/2019  5:05 PM 0.3 mL 09/11/2019 Intramuscular   Manufacturer: [place]   Lot: [id number]   [place]: [id number]

## 2020-01-06 ENCOUNTER — Ambulatory Visit: Payer: Managed Care, Other (non HMO) | Attending: Internal Medicine

## 2020-01-06 DIAGNOSIS — Z23 Encounter for immunization: Secondary | ICD-10-CM

## 2020-01-06 NOTE — Progress Notes (Signed)
   Covid-19 Vaccination Clinic  Name:  Abigail Love    MRN: [id number] DOB: 02-12-1961  01/06/2020  [name] was observed post Covid-19 immunization for 15 minutes without incident. She was provided with Vaccine Information Sheet and instruction to access the V-Safe system.   [name] was instructed to call 911 with any severe reactions post vaccine: Marland Kitchen Difficulty breathing  . Swelling of face and throat  . A fast heartbeat  . A bad rash all over body  . Dizziness and weakness   Immunizations Administered    Name Date Dose VIS Date Route   Pfizer COVID-19 Vaccine 01/06/2020  3:07 PM 0.3 mL 09/11/2019 Intramuscular   Manufacturer: [place]   Lot: [id number]   [place]: [id number]

## 2020-10-07 ENCOUNTER — Ambulatory Visit: Payer: Managed Care, Other (non HMO)

## 2021-03-22 ENCOUNTER — Other Ambulatory Visit: Payer: Self-pay | Admitting: Family Medicine

## 2021-03-22 DIAGNOSIS — H539 Unspecified visual disturbance: Secondary | ICD-10-CM

## 2021-03-22 DIAGNOSIS — R42 Dizziness and giddiness: Secondary | ICD-10-CM

## 2021-03-23 ENCOUNTER — Other Ambulatory Visit: Payer: Self-pay | Admitting: Family Medicine

## 2021-03-23 DIAGNOSIS — R42 Dizziness and giddiness: Secondary | ICD-10-CM

## 2021-03-23 DIAGNOSIS — H539 Unspecified visual disturbance: Secondary | ICD-10-CM

## 2021-03-24 ENCOUNTER — Ambulatory Visit
Admission: RE | Admit: 2021-03-24 | Discharge: 2021-03-24 | Disposition: A | Discharge status: Home / Self Care | Payer: Managed Care, Other (non HMO) | Source: Ambulatory Visit | Attending: Family Medicine | Admitting: Family Medicine

## 2021-03-24 DIAGNOSIS — H539 Unspecified visual disturbance: Secondary | ICD-10-CM

## 2021-03-24 DIAGNOSIS — R42 Dizziness and giddiness: Secondary | ICD-10-CM

## 2021-03-24 IMAGING — MR MR MRA HEAD W/O CM
1 series · 23 of 48 positions shown · non-contrast
Comparison: No pertinent prior exam.
COMPARISON: No pertinent prior exam.

CLINICAL DATA: Blurred vision left eye.  Bilateral arm numbness.

EXAM:
MRI HEAD WITHOUT CONTRAST
MRA HEAD WITHOUT CONTRAST
TECHNIQUE: Multiplanar, multi-echo pulse sequences of the brain and surrounding
structures were acquired without intravenous contrast. Angiographic
images of the Circle of Willis were acquired using MRA technique
without intravenous contrast.

[Series 3: tof_3d_multi-slab new · axial · 0.7mm · 0.35mm/px · z∈[-40,+45]mm · 23 of 130 slices shown]
[im 1/130]
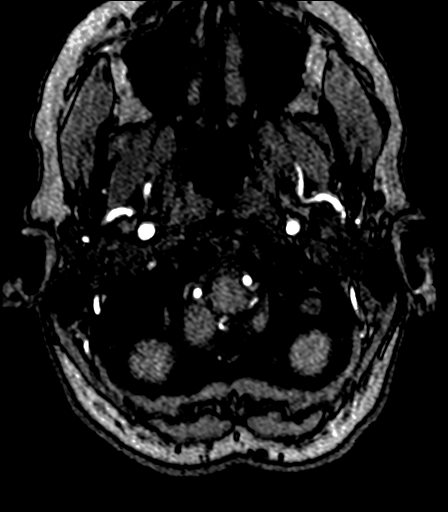
[im 3/130]
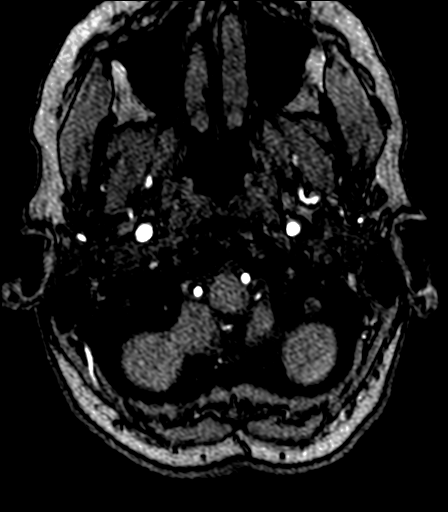
[im 6/130]
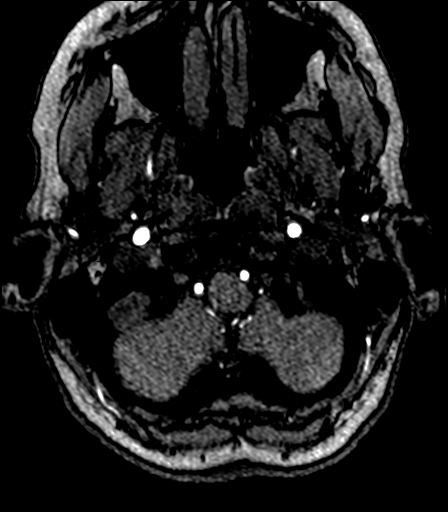
[im 9/130]
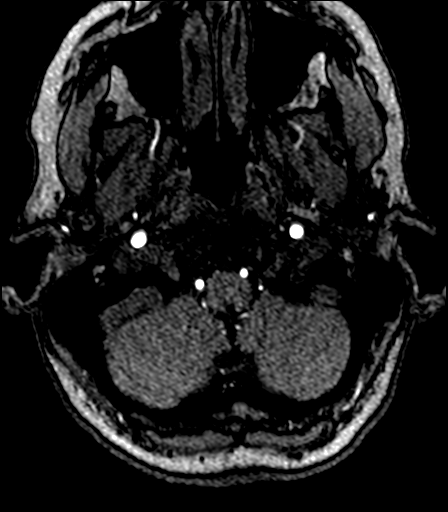
[im 11/130]
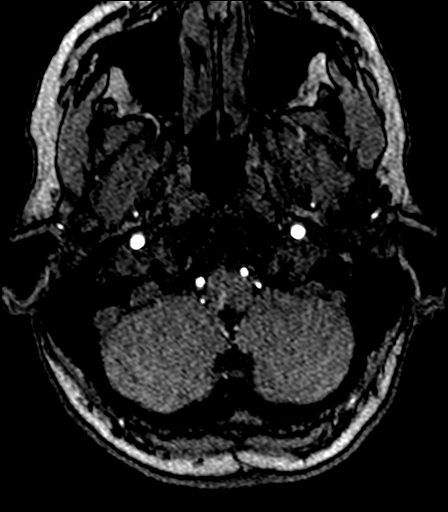
[im 14/130]
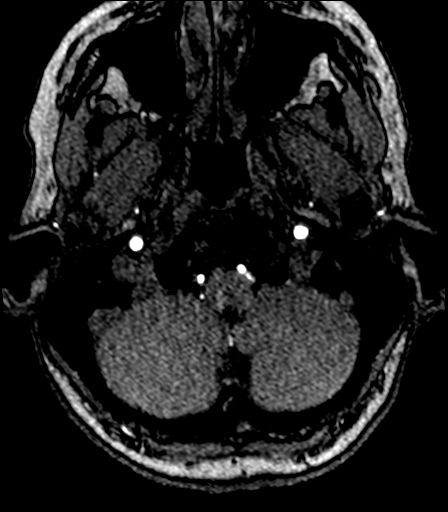
[im 17/130]
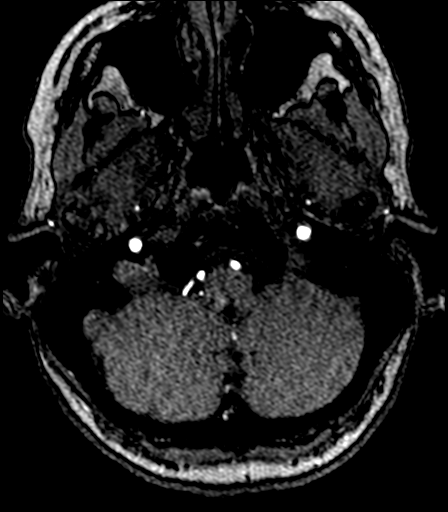
[im 20/130]
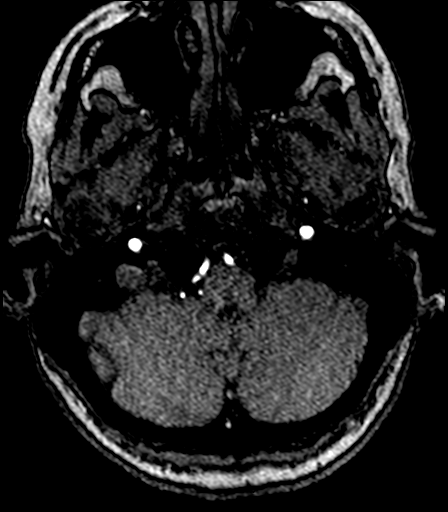
[im 22/130]
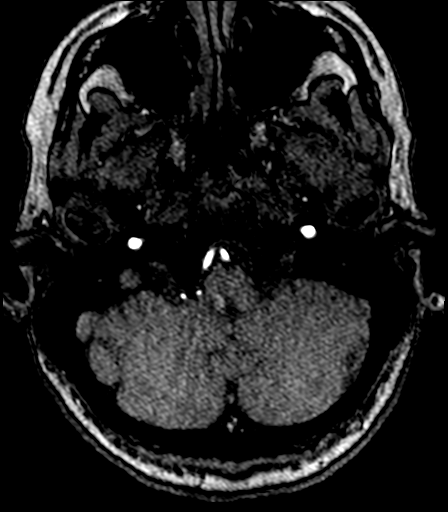
[im 25/130]
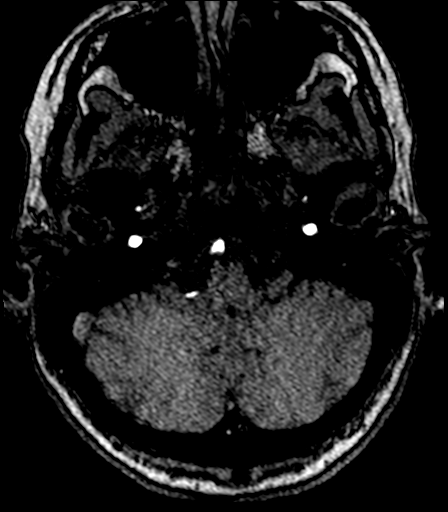
[im 28/130]
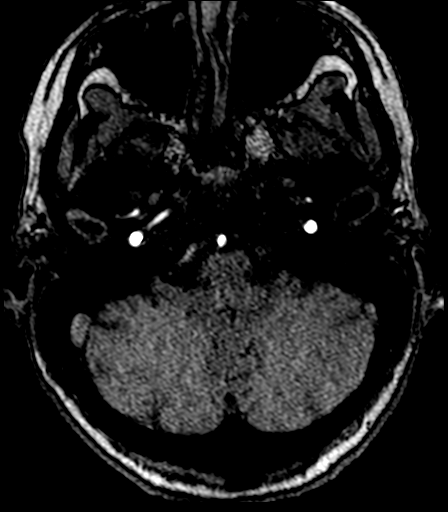
[im 31/130]
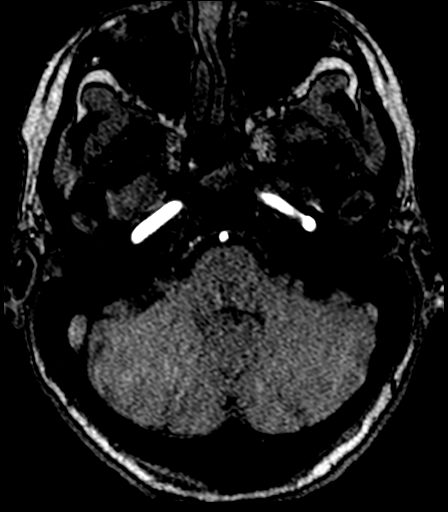
[im 33/130]
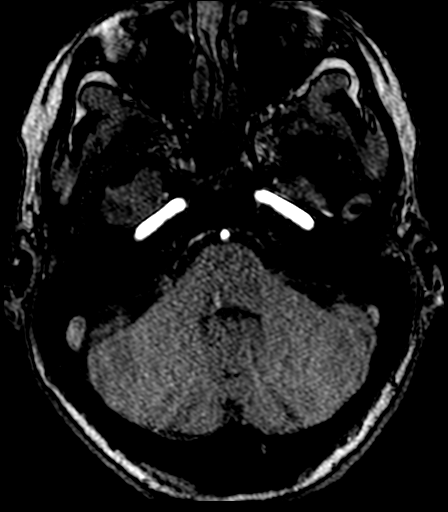
[im 36/130]
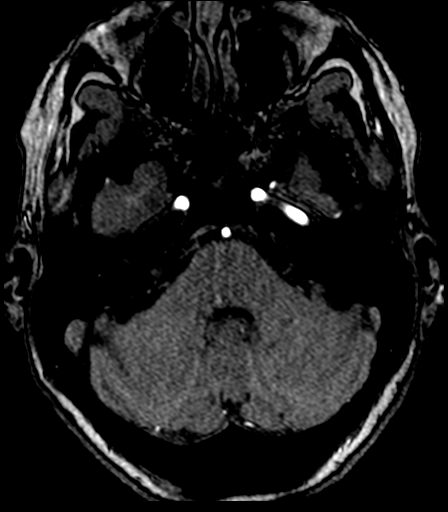
[im 39/130]
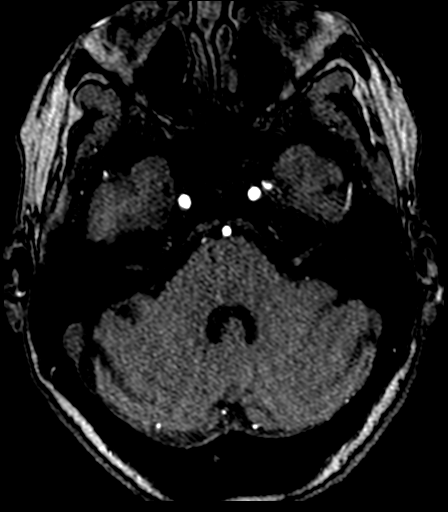
[im 42/130]
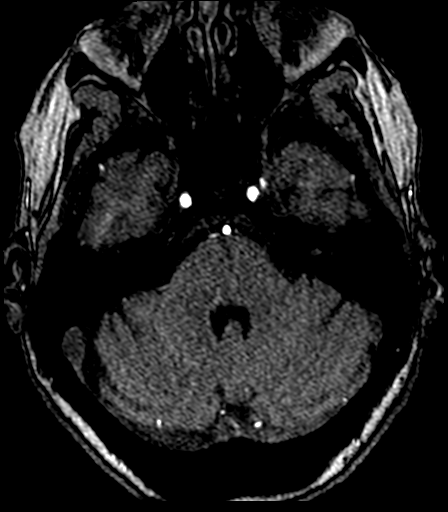
[im 58/130]
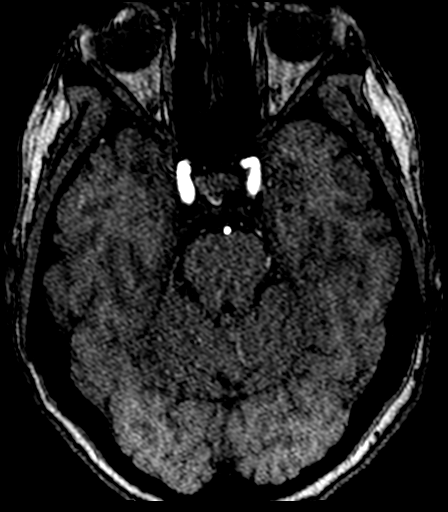
[im 66/130]
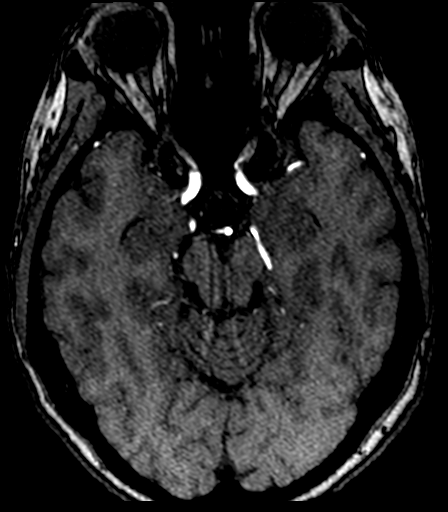
[im 75/130]
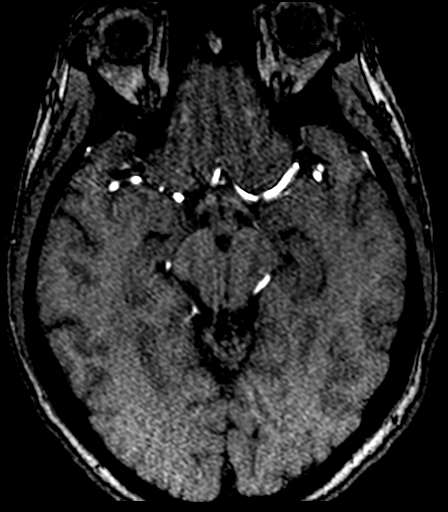
[im 91/130]
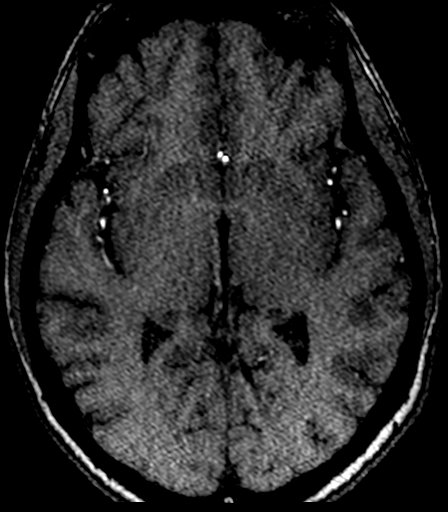
[im 108/130]
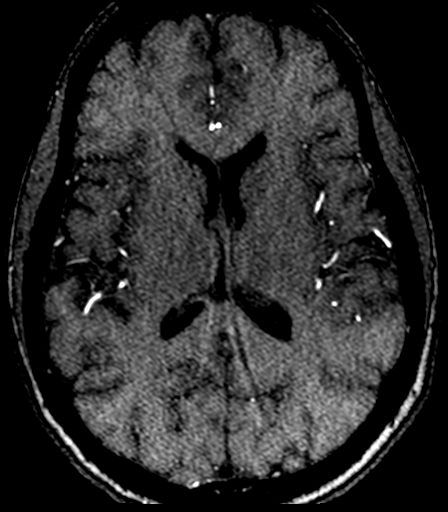
[im 110/130]
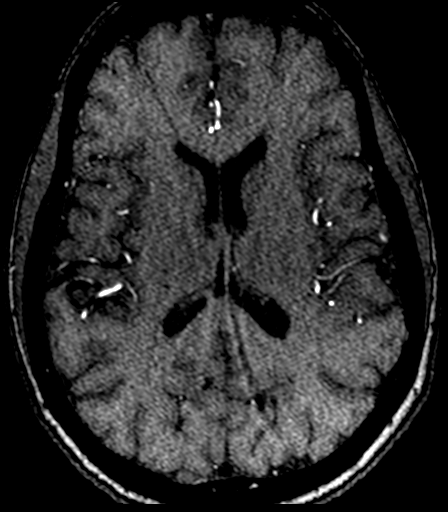
[im 124/130]
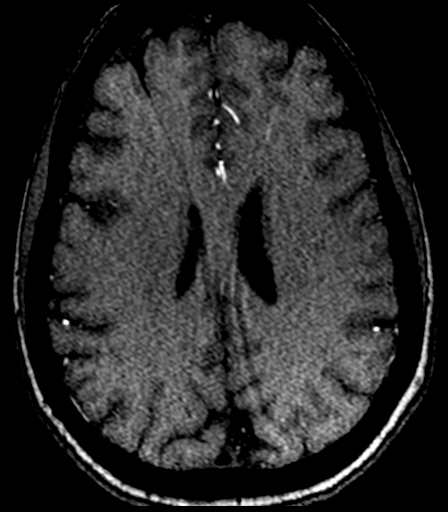

[23 of 48 positions shown; findings below may reference images not displayed]

FINDINGS: MRI HEAD FINDINGS

Brain: No acute infarction, hemorrhage, hydrocephalus, extra-axial
collection or mass lesion. Normal white matter. Pituitary not
enlarged

Vascular: Normal arterial flow voids

Skull and upper cervical spine: Negative

Sinuses/Orbits: Negative

Other: None

MRA HEAD FINDINGS

Anterior circulation: Internal carotid artery widely patent and
normal bilaterally. Anterior and middle cerebral arteries normal
bilaterally. No stenosis or occlusion.

Posterior circulation: Both vertebral arteries widely patent. PICA
patent bilaterally. Basilar widely patent. Fetal origin right
posterior cerebral artery widely patent. Left posterior cerebral
artery normal.

Anatomic variants: None
IMPRESSION: Normal MRI head

Normal MRA head

## 2021-03-24 IMAGING — MR MR HEAD W/O CM
10 series · 48 of 48 positions shown · non-contrast
Comparison: No pertinent prior exam.
COMPARISON: No pertinent prior exam.

CLINICAL DATA: Blurred vision left eye.  Bilateral arm numbness.

EXAM:
MRI HEAD WITHOUT CONTRAST
MRA HEAD WITHOUT CONTRAST
TECHNIQUE: Multiplanar, multi-echo pulse sequences of the brain and surrounding
structures were acquired without intravenous contrast. Angiographic
images of the Circle of Willis were acquired using MRA technique
without intravenous contrast.

[Series 1: T1 · sagittal · 5.0mm · 0.45mm/px · 3 of 25 slices shown]
[im 1/25]
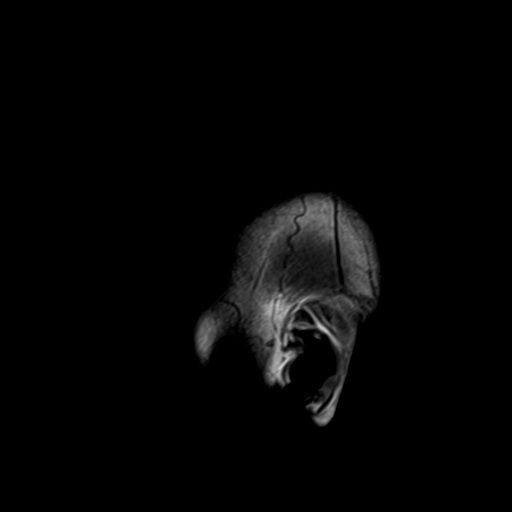
[im 13/25]
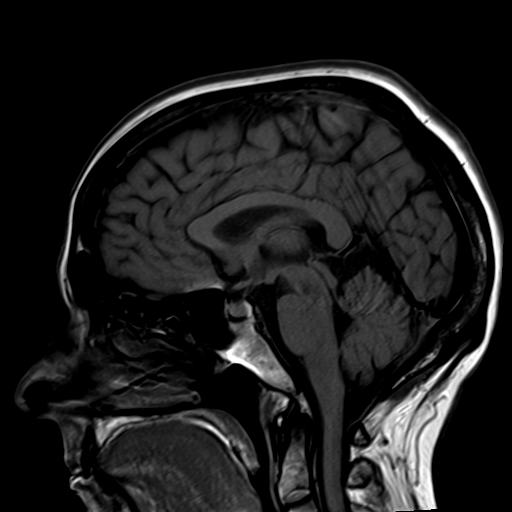
[im 25/25]
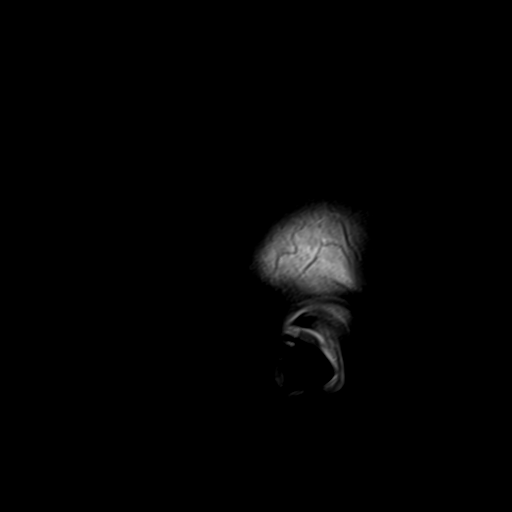

[Series 2: ax ep2d_diff_3 · axial · 3.0mm · 1.80mm/px · z∈[-64,+98]mm · 8 of 109 slices shown]
[im 1/109]
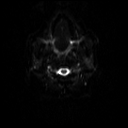
[im 16/109]
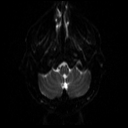
[im 31/109]
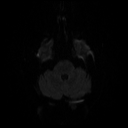
[im 47/109]
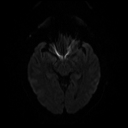
[im 62/109]
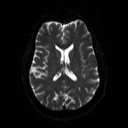
[im 78/109]
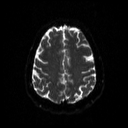
[im 93/109]
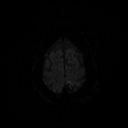
[im 109/109]
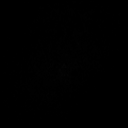

[Series 3: ax ep2d_diff_3_adc · axial · 3.0mm · 1.80mm/px · z∈[-64,+98]mm · 4 of 54 slices shown]
[im 1/54]
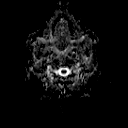
[im 18/54]
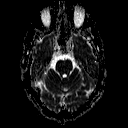
[im 36/54]
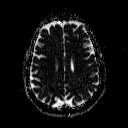
[im 54/54]
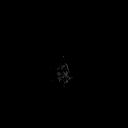

[Series 4: cor ep2d_diff · coronal · 5.0mm · 1.77mm/px · 5 of 64 slices shown]
[im 1/64]
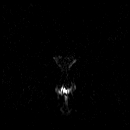
[im 16/64]
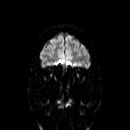
[im 32/64]
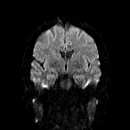
[im 48/64]
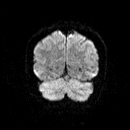
[im 64/64]
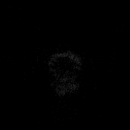

[Series 5: cor ep2d_diff_adc · coronal · 5.0mm · 1.77mm/px · 2 of 32 slices shown]
[im 1/32]
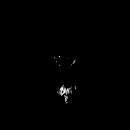
[im 32/32]
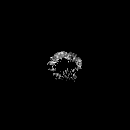

[Series 7: swi_images · axial · 2.0mm · 0.98mm/px · z∈[-61,+96]mm · 6 of 80 slices shown]
[im 1/80]
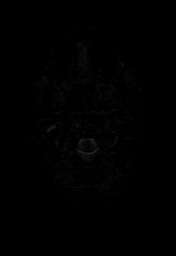
[im 16/80]
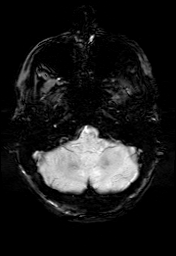
[im 32/80]
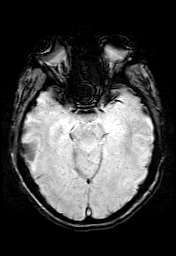
[im 48/80]
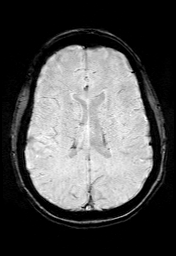
[im 64/80]
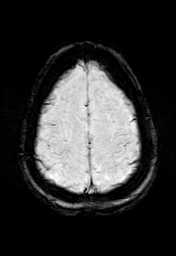
[im 80/80]
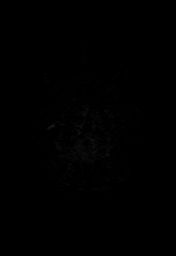

[Series 8: FLAIR · axial · 3.0mm · 0.43mm/px · z∈[-62,+98]mm · 3 of 42 slices shown]
[im 1/42]
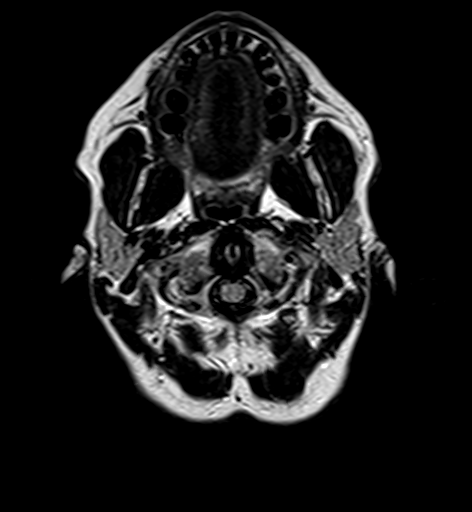
[im 21/42]
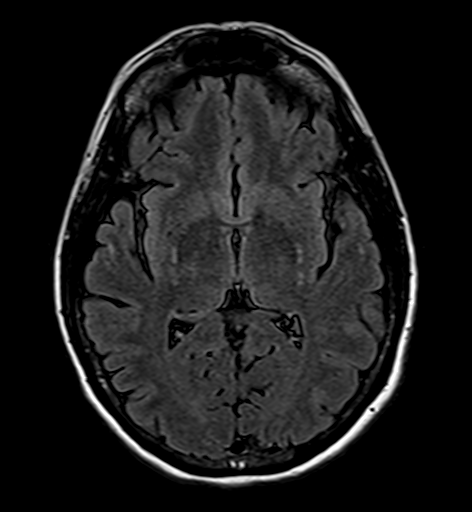
[im 42/42]
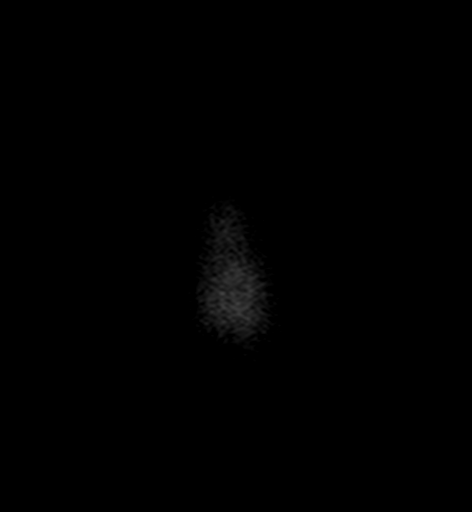

[Series 9: T2 · axial · 5.0mm · 0.65mm/px · z∈[-67,+101]mm · 2 of 29 slices shown (1 of 2)]
[im 1/29]
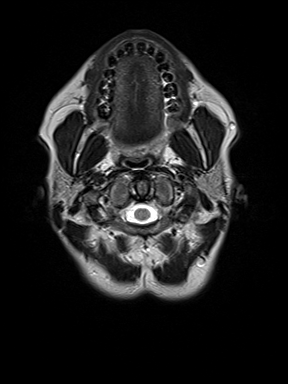
[im 29/29]
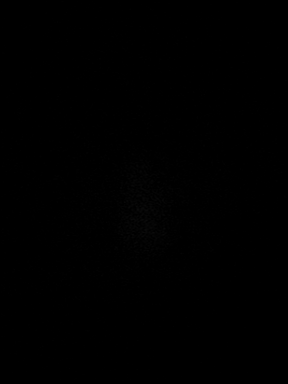

[Series 10: t1_mpr_tra · axial · 1.0mm · 0.72mm/px · z∈[-69,+105]mm · 13 of 176 slices shown]
[im 1/176]
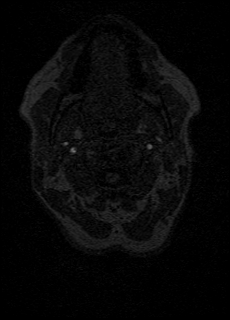
[im 15/176]
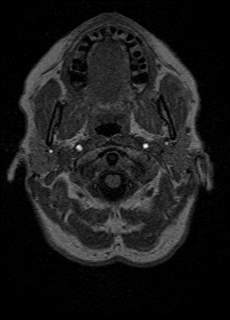
[im 30/176]
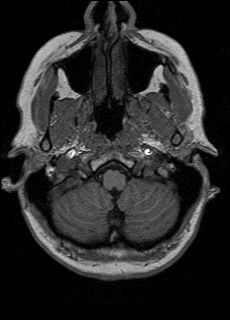
[im 44/176]
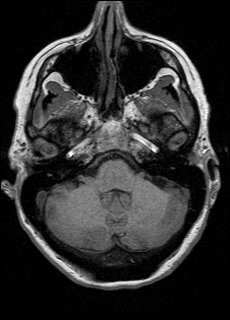
[im 59/176]
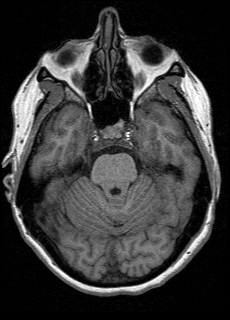
[im 73/176]
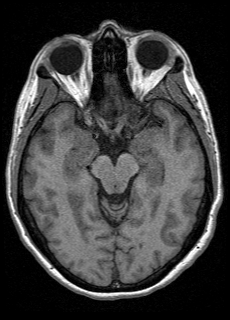
[im 88/176]
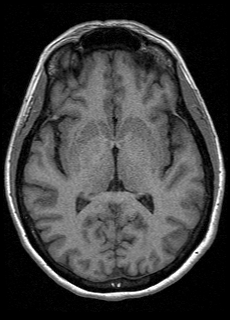
[im 103/176]
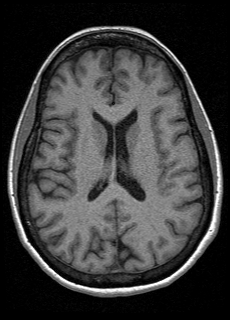
[im 117/176]
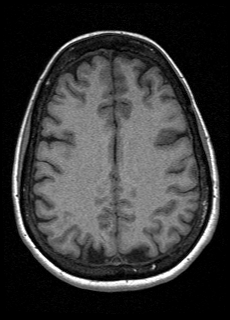
[im 132/176]
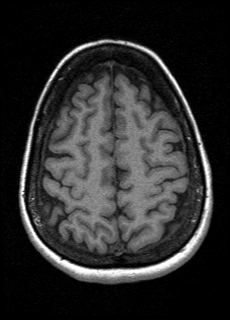
[im 146/176]
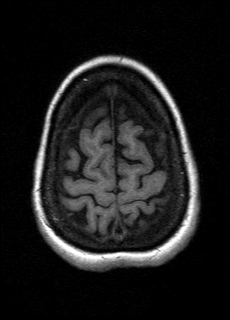
[im 161/176]
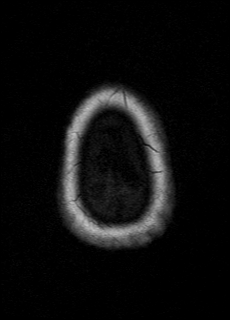
[im 176/176]
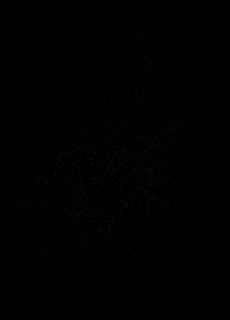

[Series 11: T2 · coronal · 5.0mm · 0.45mm/px · 2 of 33 slices shown (2 of 2)]
[im 1/33]
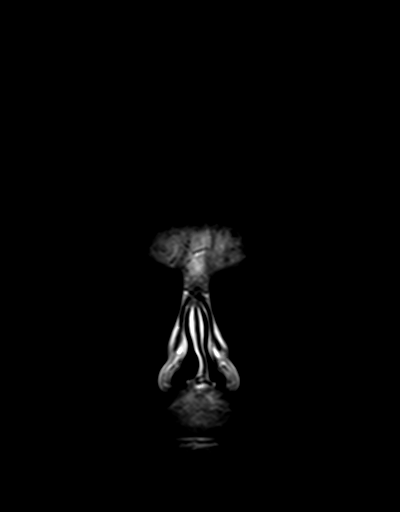
[im 33/33]
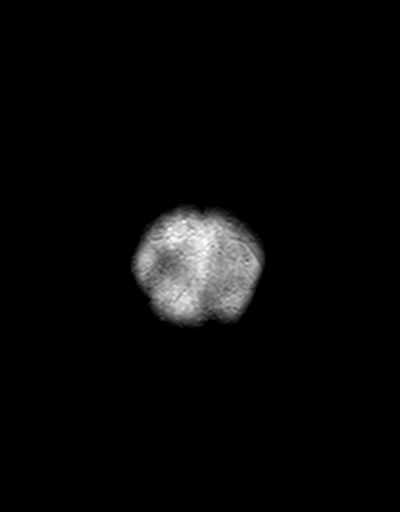

[48 of 48 positions shown; findings below may reference images not displayed]

FINDINGS: MRI HEAD FINDINGS

Brain: No acute infarction, hemorrhage, hydrocephalus, extra-axial
collection or mass lesion. Normal white matter. Pituitary not
enlarged

Vascular: Normal arterial flow voids

Skull and upper cervical spine: Negative

Sinuses/Orbits: Negative

Other: None

MRA HEAD FINDINGS

Anterior circulation: Internal carotid artery widely patent and
normal bilaterally. Anterior and middle cerebral arteries normal
bilaterally. No stenosis or occlusion.

Posterior circulation: Both vertebral arteries widely patent. PICA
patent bilaterally. Basilar widely patent. Fetal origin right
posterior cerebral artery widely patent. Left posterior cerebral
artery normal.

Anatomic variants: None
IMPRESSION: Normal MRI head

Normal MRA head

## 2021-03-30 ENCOUNTER — Ambulatory Visit
Admission: RE | Admit: 2021-03-30 | Discharge: 2021-03-30 | Disposition: A | Discharge status: Home / Self Care | Payer: Managed Care, Other (non HMO) | Source: Ambulatory Visit | Attending: Family Medicine | Admitting: Family Medicine

## 2021-03-30 DIAGNOSIS — H539 Unspecified visual disturbance: Secondary | ICD-10-CM

## 2021-03-30 DIAGNOSIS — R42 Dizziness and giddiness: Secondary | ICD-10-CM

## 2021-03-30 IMAGING — US US CAROTID DUPLEX BILAT
1 series · 13 of 24 positions shown · non-contrast
Comparison: None.

CLINICAL DATA: Visual changes, vertigo

EXAM:
BILATERAL CAROTID DUPLEX ULTRASOUND
TECHNIQUE: Gray scale imaging, color Doppler and duplex ultrasound were
performed of bilateral carotid and vertebral arteries in the neck.

[Series 1: us carotid duplex bilat · 0.07mm/px · 13 of 79 slices shown]
[im 1/79]
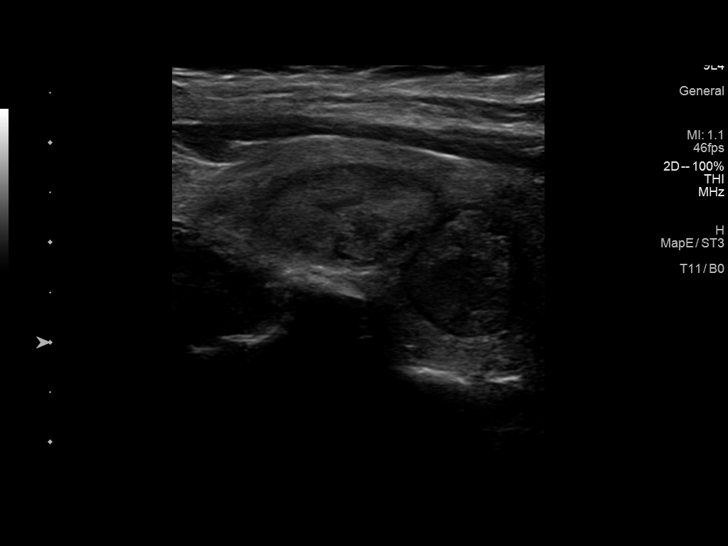
[im 7/79]
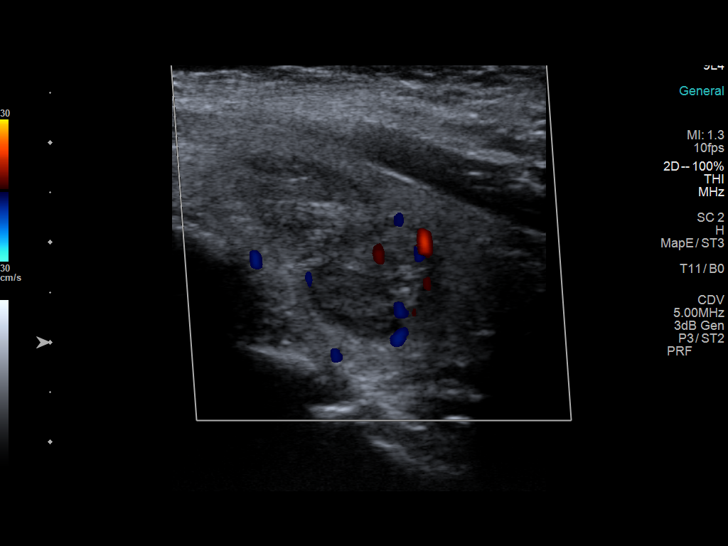
[im 14/79]
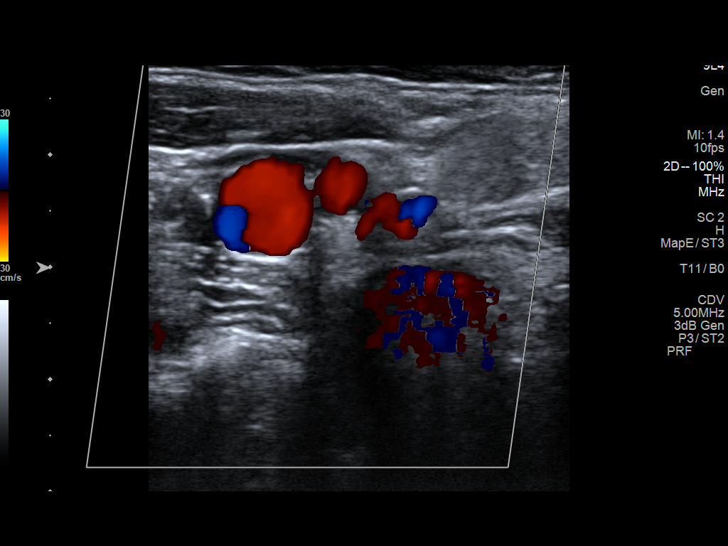
[im 21/79]
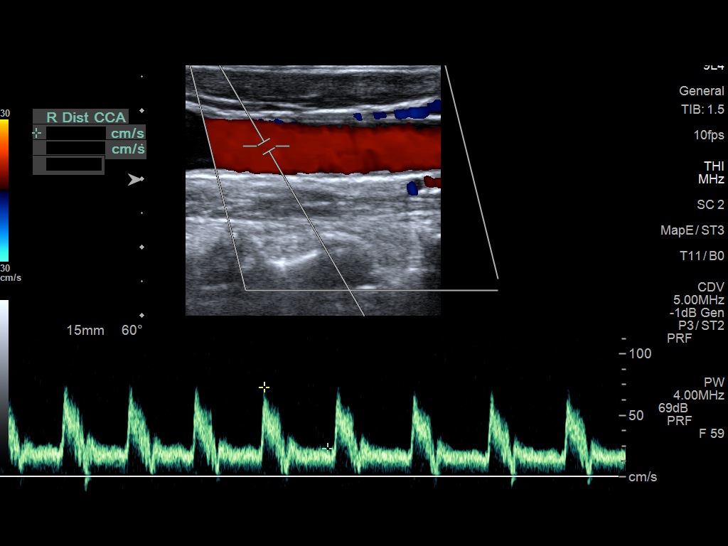
[im 28/79]
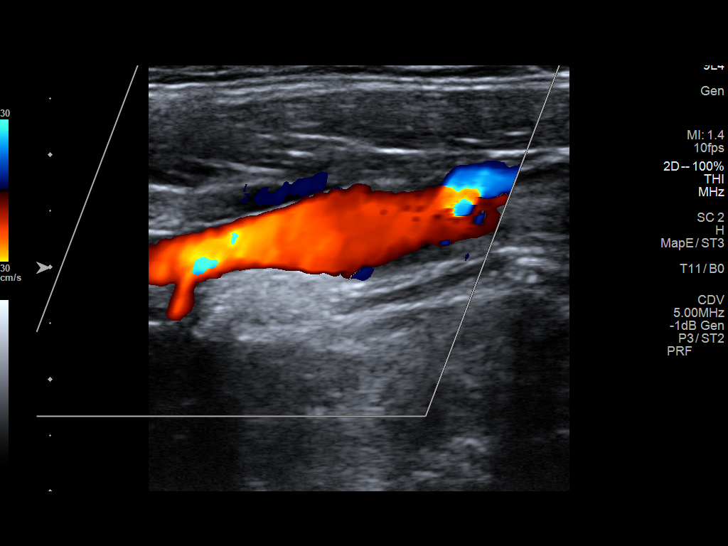
[im 34/79]
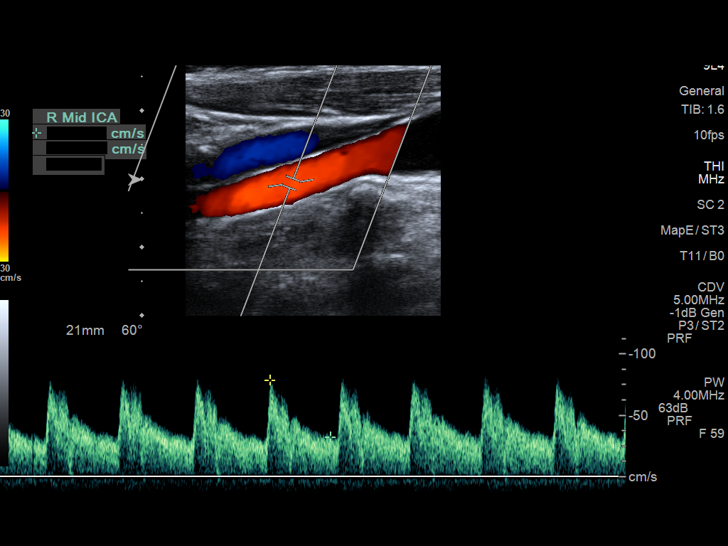
[im 41/79]
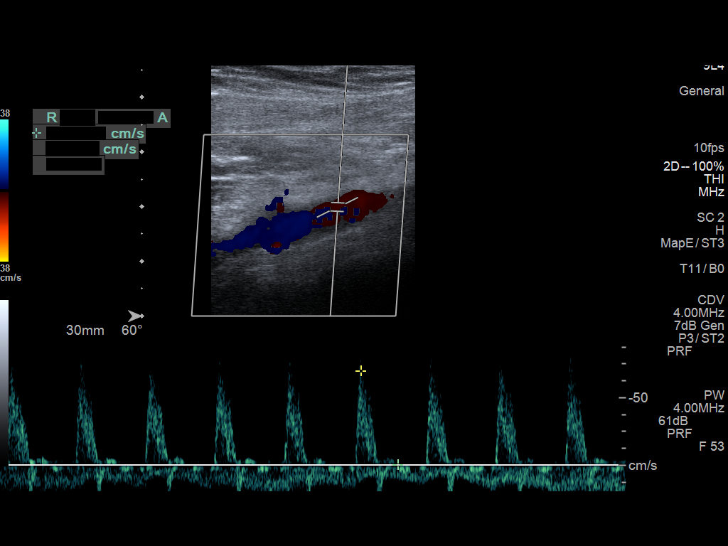
[im 45/79]
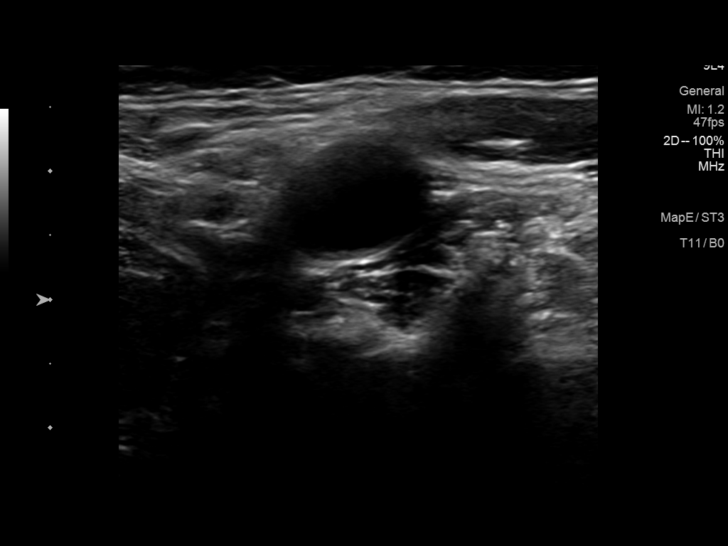
[im 51/79]
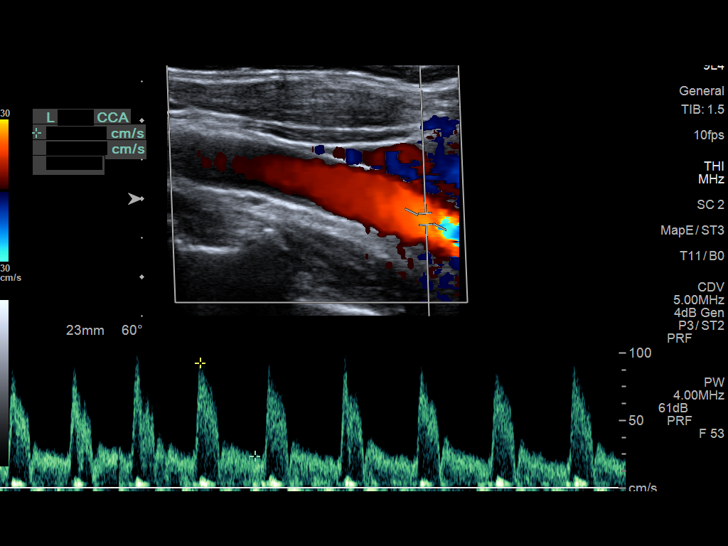
[im 58/79]
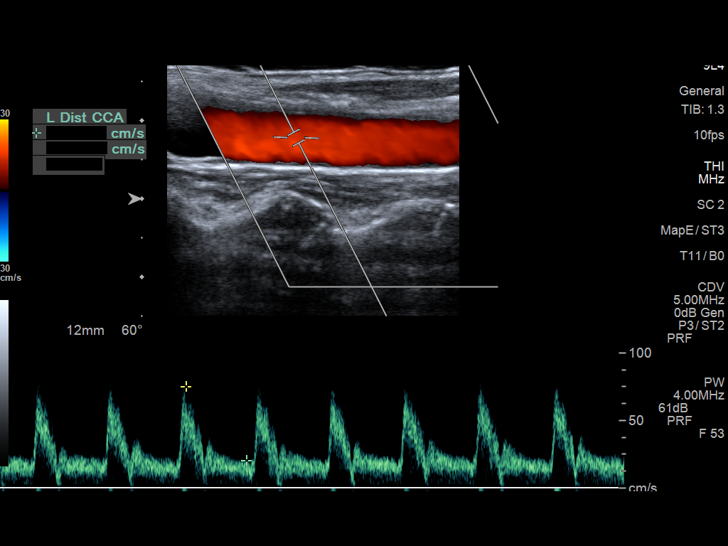
[im 65/79]
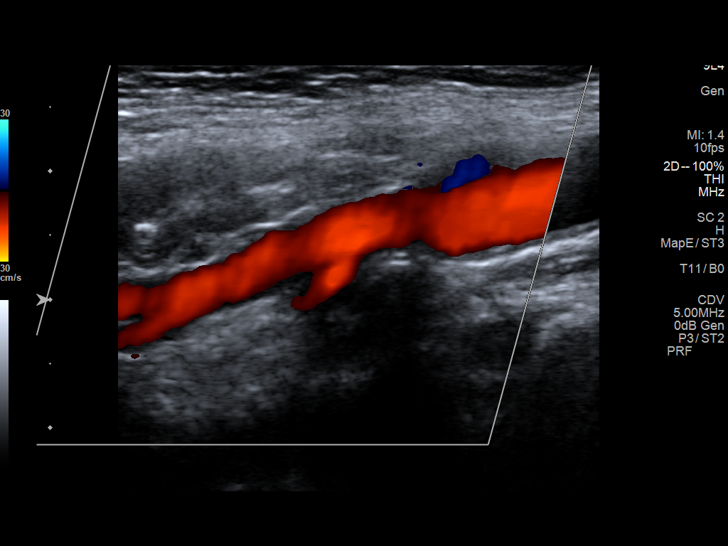
[im 72/79]
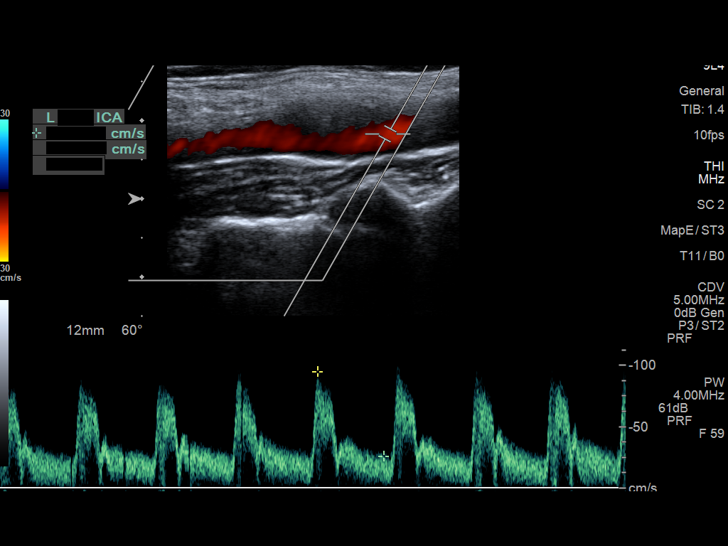
[im 79/79]
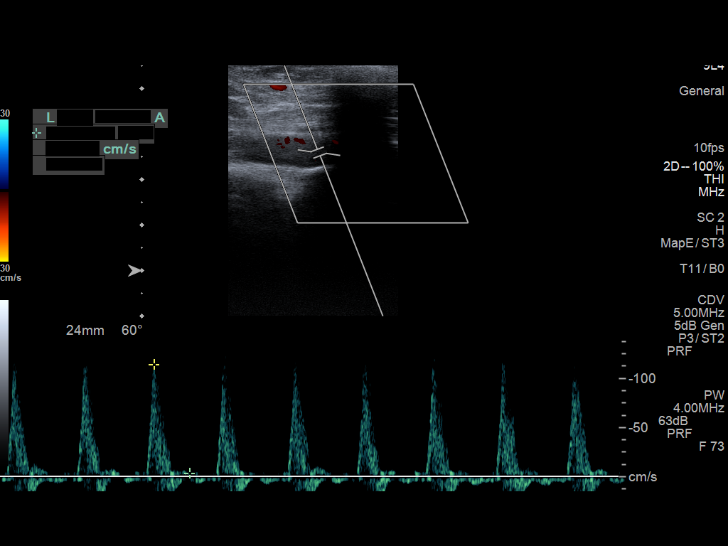

[13 of 24 positions shown; findings below may reference images not displayed]

FINDINGS: Criteria: Quantification of carotid stenosis is based on velocity
parameters that correlate the residual internal carotid diameter
with NASCET-based stenosis levels, using the diameter of the distal
internal carotid lumen as the denominator for stenosis measurement.

The following velocity measurements were obtained:

RIGHT
ICA: 93/40 cm/sec
CCA: 89/13 cm/sec

SYSTOLIC ICA/CCA RATIO:  1

ECA:  92 cm/sec

LEFT

ICA: 95/26 cm/sec

CCA: 93/26 cm/sec

SYSTOLIC ICA/CCA RATIO:  1

ECA:  93 cm/sec

RIGHT CAROTID ARTERY: No significant atherosclerotic plaque or
evidence of stenosis in the internal carotid artery.

RIGHT VERTEBRAL ARTERY:  Patent with normal antegrade flow.

LEFT CAROTID ARTERY: Mild heterogeneous atherosclerotic plaque in
the proximal internal carotid artery. By peak systolic velocity
criteria, the estimated stenosis is less than 50%.

LEFT VERTEBRAL ARTERY:  Patent with normal antegrade flow.
IMPRESSION: 1. Mild (1-49%) stenosis proximal left internal carotid artery
secondary to heterogenous atherosclerotic plaque.
2. No significant atherosclerotic plaque or stenosis in the right
internal carotid artery.
3. Vertebral arteries are patent with normal antegrade flow.

## 2021-05-05 LAB — COLOGUARD: COLOGUARD: NEGATIVE

## 2021-09-01 ENCOUNTER — Other Ambulatory Visit: Payer: Self-pay | Admitting: Obstetrics and Gynecology

## 2021-09-01 DIAGNOSIS — Z1329 Encounter for screening for other suspected endocrine disorder: Secondary | ICD-10-CM

## 2021-09-15 ENCOUNTER — Ambulatory Visit
Admission: RE | Admit: 2021-09-15 | Discharge: 2021-09-15 | Disposition: A | Discharge status: Home / Self Care | Payer: Managed Care, Other (non HMO) | Source: Ambulatory Visit | Attending: Obstetrics and Gynecology | Admitting: Obstetrics and Gynecology

## 2021-09-15 DIAGNOSIS — Z1329 Encounter for screening for other suspected endocrine disorder: Secondary | ICD-10-CM

## 2021-09-15 IMAGING — US US THYROID
1 series · 13 of 25 positions shown · non-contrast
Comparison: None available

CLINICAL DATA: Nodule by outside imaging

EXAM:
THYROID ULTRASOUND
TECHNIQUE: Ultrasound examination of the thyroid gland and adjacent soft
tissues was performed.

[Series 1: us thyroid · 0.07mm/px · 54 acquisitions, 13 frames shown]
[im 1/54]
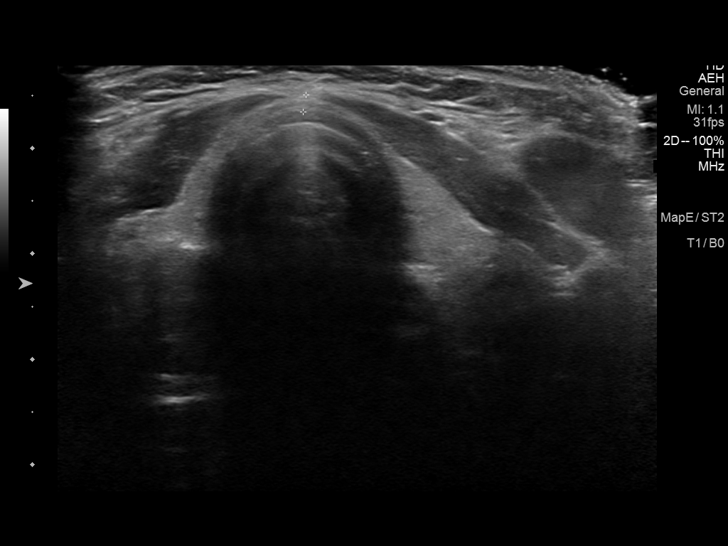
[im 5/54]
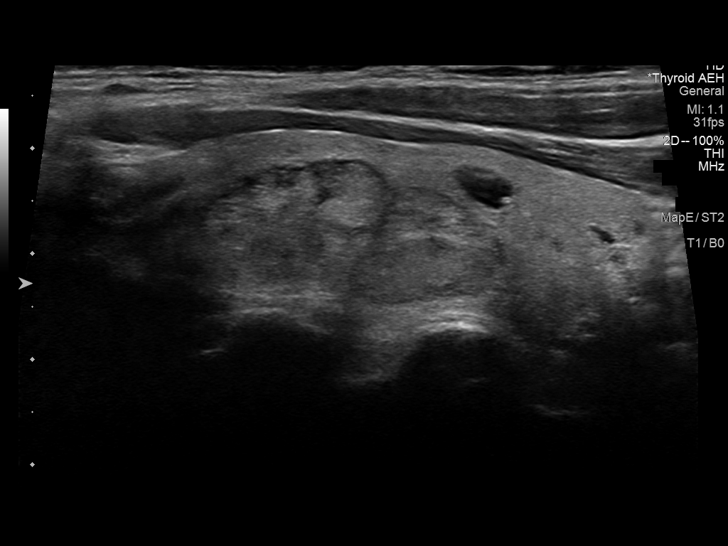
[im 9/54]
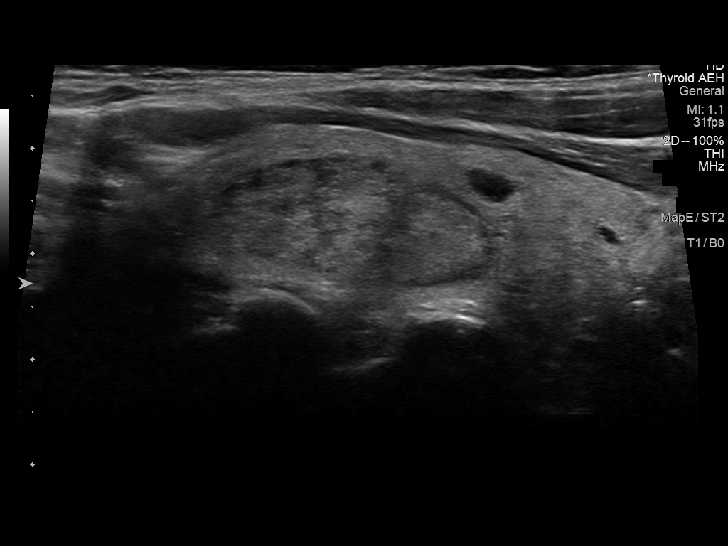
[im 14/54]
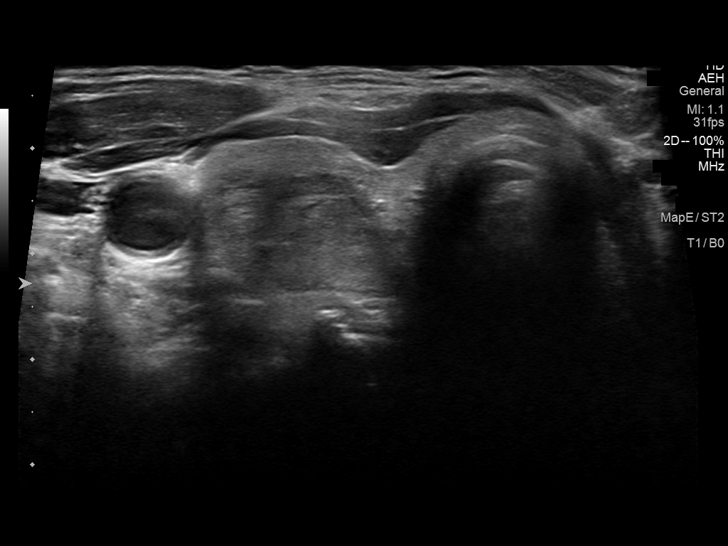
[im 18/54]
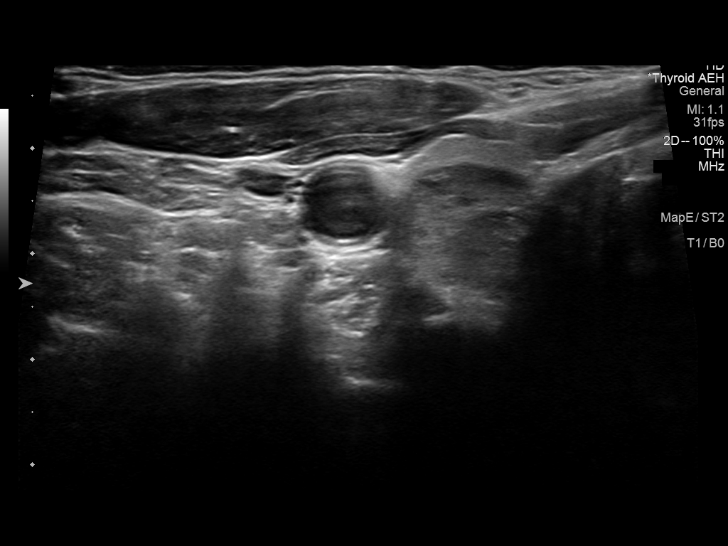
[im 23/54]
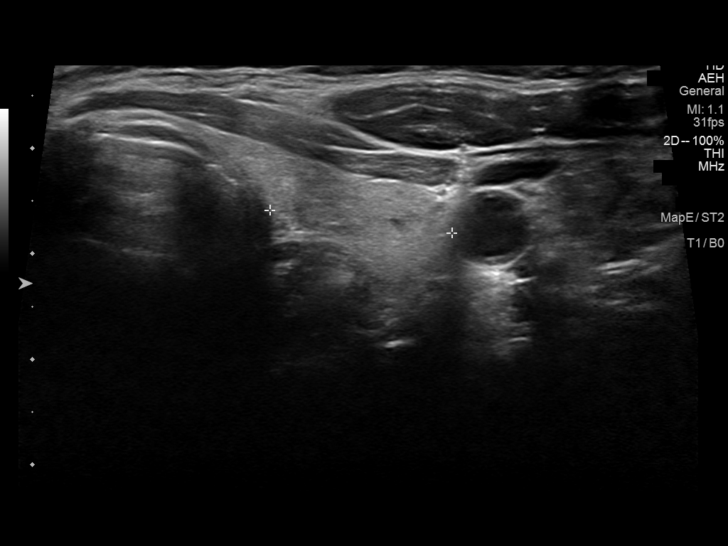
[im 27/54]
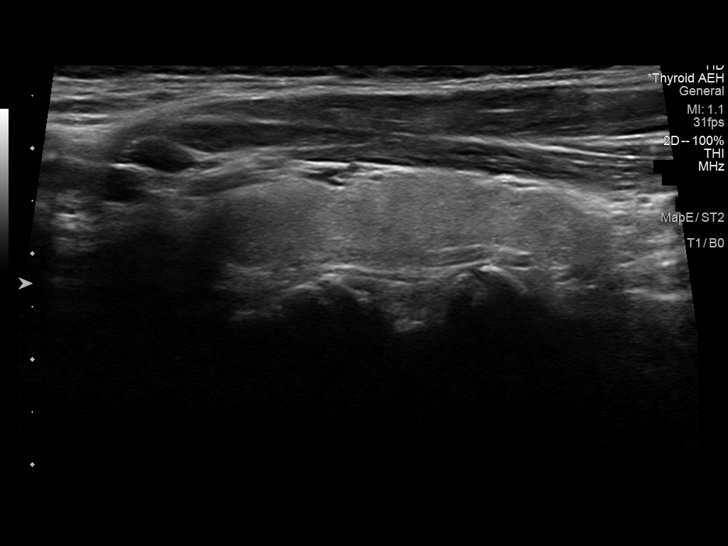
[im 31/54]
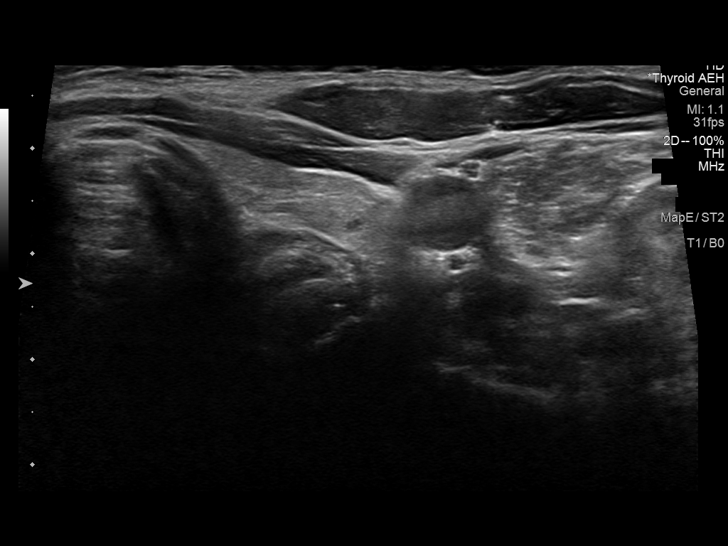
[im 36/54]
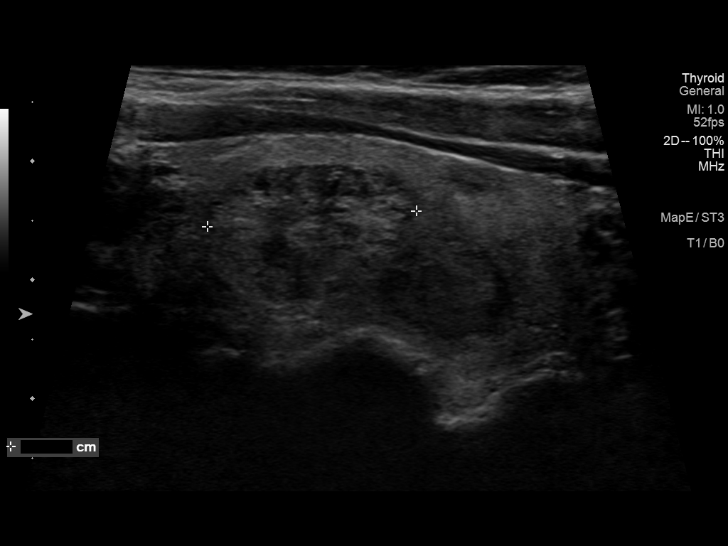
[im 40/54]
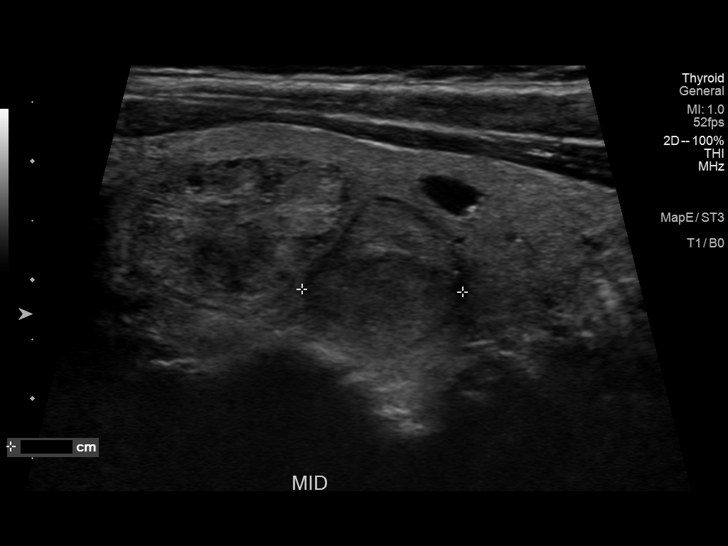
[im 45/54]
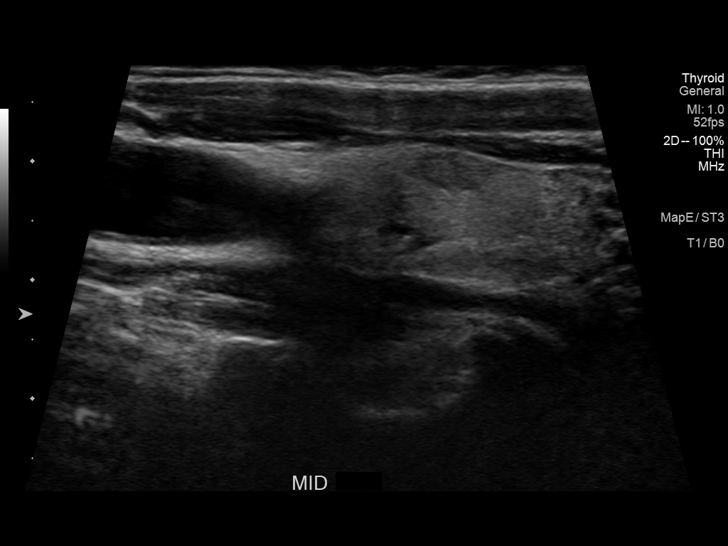
[im 49/54]
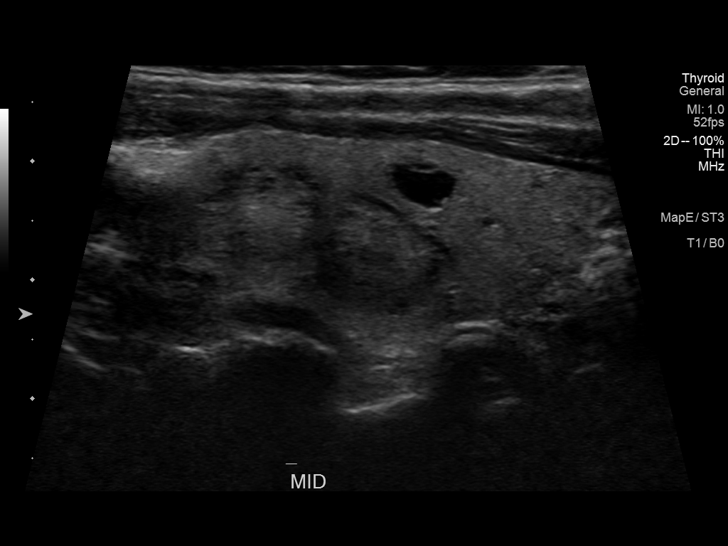
[im 54/54]
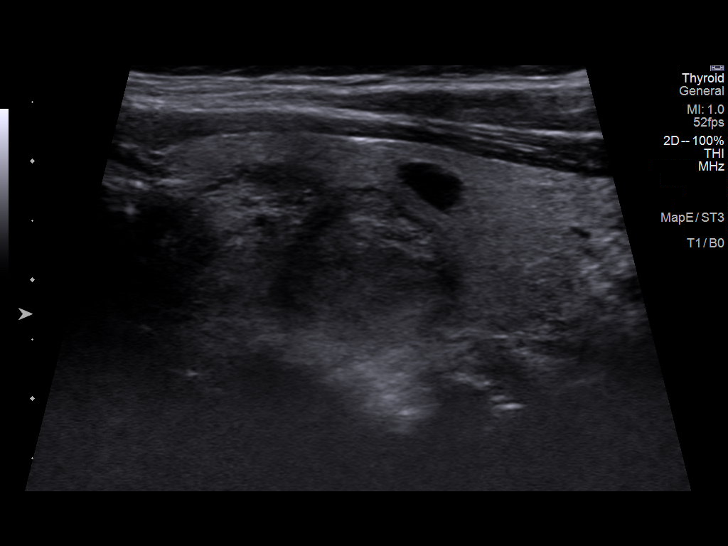

[13 of 25 positions shown; findings below may reference images not displayed]

FINDINGS: Parenchymal Echotexture: Mildly heterogenous

Isthmus: 2 mm

Right lobe: 5.1 x 1.6 x 1.9 cm

Left lobe: 3.8 x 1.1 x 1.7 cm

_________________________________________________________

Estimated total number of nodules >/= 1 cm: 2

Number of spongiform nodules >/=  2 cm not described below (TR1): 0

Number of mixed cystic and solid nodules >/= 1.5 cm not described
below (TR2): 0

_________________________________________________________

Nodule # 1:

Location: Right; Mid

Maximum size: 1.8 cm; Other 2 dimensions: 1.2 x 1.1 cm

Composition: solid/almost completely solid (2)

Echogenicity: isoechoic (1)

Shape: not taller-than-wide (0)

Margins: ill-defined (0)

Echogenic foci: none (0)

ACR TI-RADS total points: 3.

ACR TI-RADS risk category: TR3 (3 points).

ACR TI-RADS recommendations:

*Given size (>/= 1.5 - 2.4 cm) and appearance, a follow-up
ultrasound in 1 year should be considered based on TI-RADS criteria.

_________________________________________________________

Nodule # 2:

Location: Right; Mid

Maximum size: 1.5 cm; Other 2 dimensions: 1.4 x 1.2 cm

Composition: solid/almost completely solid (2)

Echogenicity: isoechoic (1)

Shape: not taller-than-wide (0)

Margins: ill-defined (0)

Echogenic foci: none (0)

ACR TI-RADS total points: 3.

ACR TI-RADS risk category: TR3 (3 points).

ACR TI-RADS recommendations:

*Given size (>/= 1.5 - 2.4 cm) and appearance, a follow-up
ultrasound in 1 year should be considered based on TI-RADS criteria.

_________________________________________________________

Adjacent right mid thyroid subcentimeter nodules noted all measuring
6 mm or less which are isoechoic and cystic in nature. These would
not meet criteria for any biopsy or follow-up and are not fully
described by TI rads criteria.

No significant left thyroid abnormality.

No hypervascularity or regional adenopathy.
IMPRESSION: 1.8 cm right mid thyroid TR 3 nodule and adjacent 1.5 cm right mid
thyroid TR 3 nodule. Both meet criteria for follow-up in 1 year.

Additional findings as above.

The above is in keeping with the ACR TI-RADS recommendations - [HOSPITAL] [TR];[DATE].

## 2022-01-17 ENCOUNTER — Other Ambulatory Visit: Payer: Self-pay | Admitting: Endocrinology

## 2022-01-17 DIAGNOSIS — E049 Nontoxic goiter, unspecified: Secondary | ICD-10-CM

## 2022-03-01 ENCOUNTER — Ambulatory Visit
Admission: RE | Admit: 2022-03-01 | Discharge: 2022-03-01 | Disposition: A | Discharge status: Home / Self Care | Payer: Managed Care, Other (non HMO) | Source: Ambulatory Visit | Attending: Endocrinology | Admitting: Endocrinology

## 2022-03-01 DIAGNOSIS — E049 Nontoxic goiter, unspecified: Secondary | ICD-10-CM

## 2022-03-01 IMAGING — US US THYROID
1 series · 13 of 25 positions shown · non-contrast
Comparison: Prior thyroid ultrasound [DATE]

CLINICAL DATA: Prior ultrasound follow-up. Right-sided thyroid
nodules currently under imaging surveillance

EXAM:
THYROID ULTRASOUND
TECHNIQUE: Ultrasound examination of the thyroid gland and adjacent soft
tissues was performed.

[Series 1: us thyroid · 0.05mm/px · 13 of 43 slices shown]
[im 1/43]
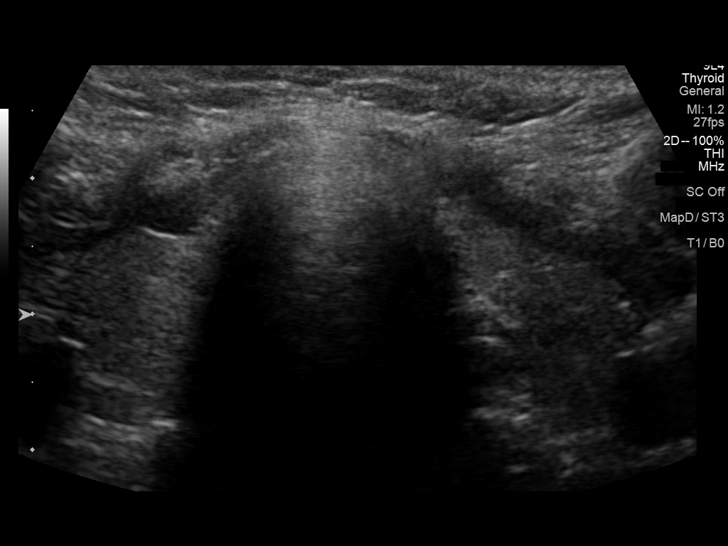
[im 4/43]
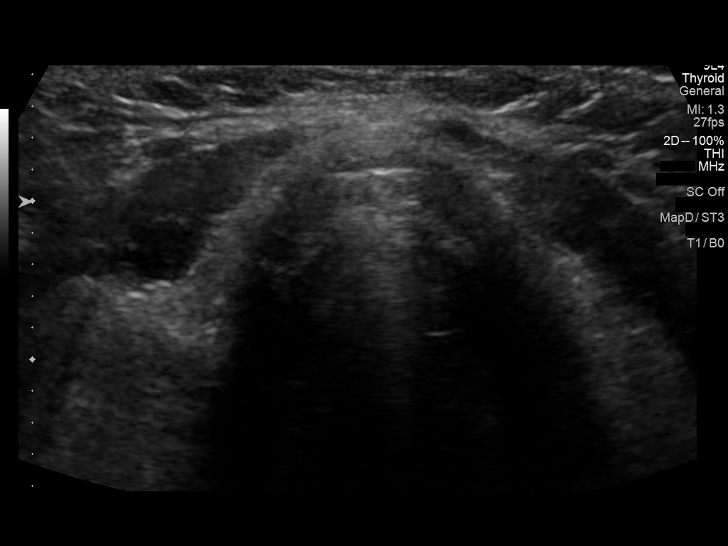
[im 8/43]
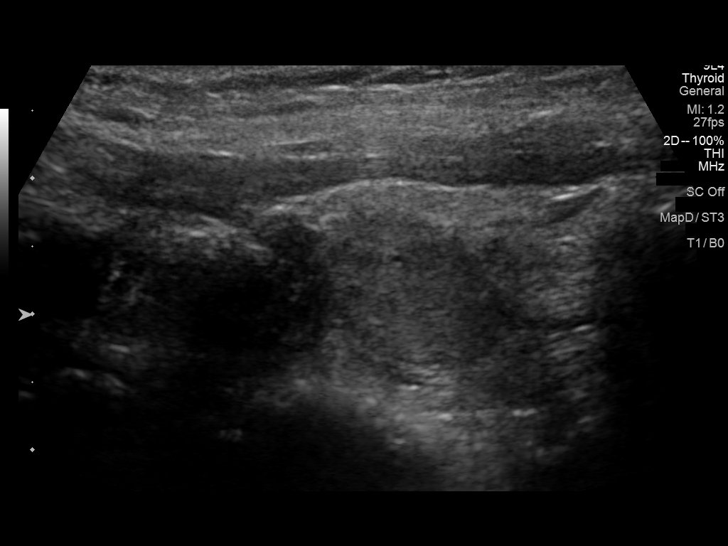
[im 11/43]
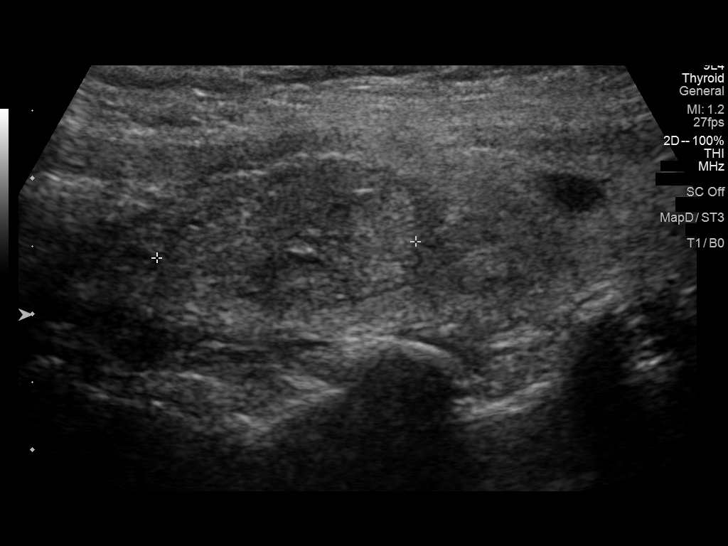
[im 15/43]
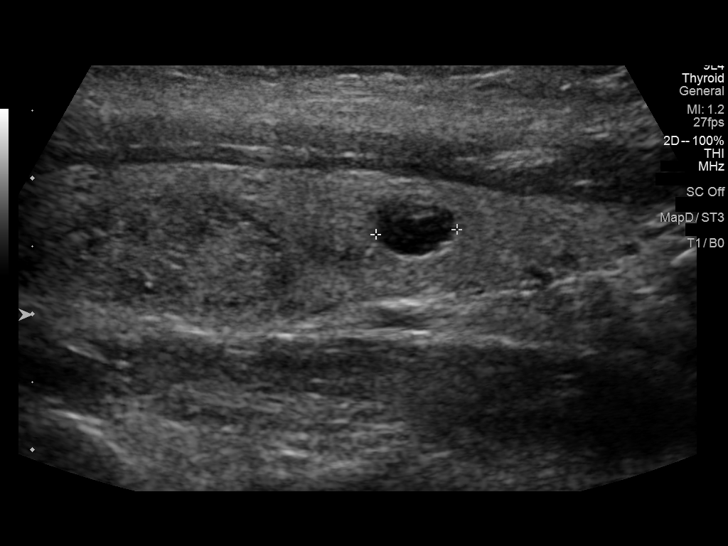
[im 18/43]
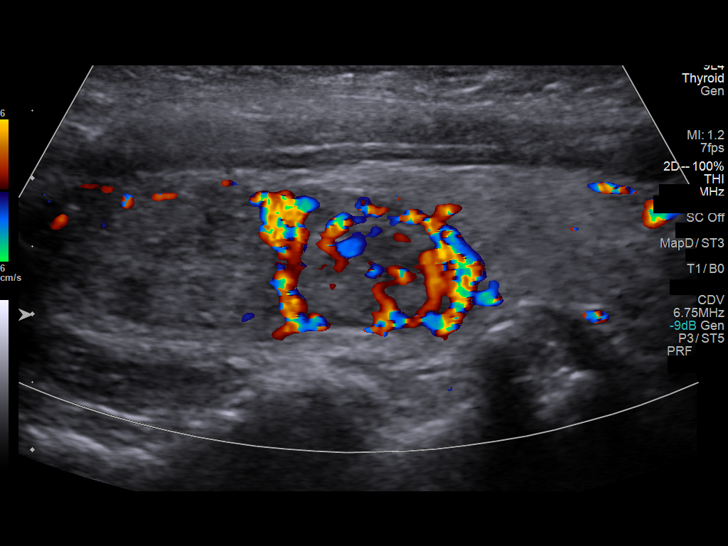
[im 22/43]
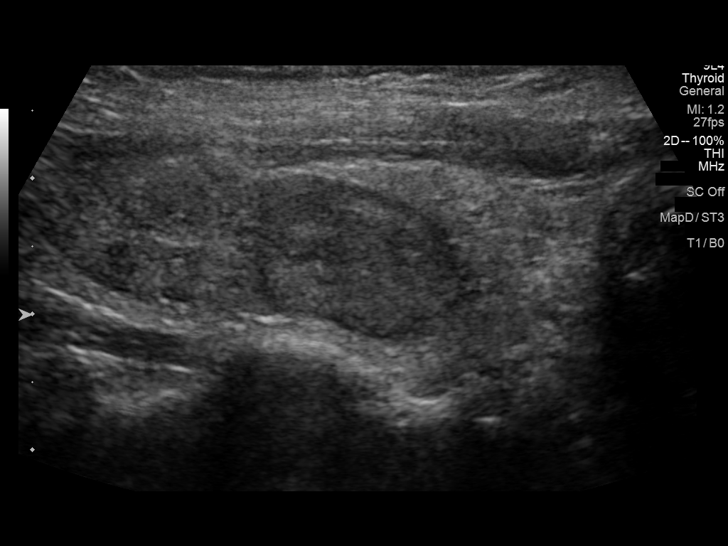
[im 25/43]
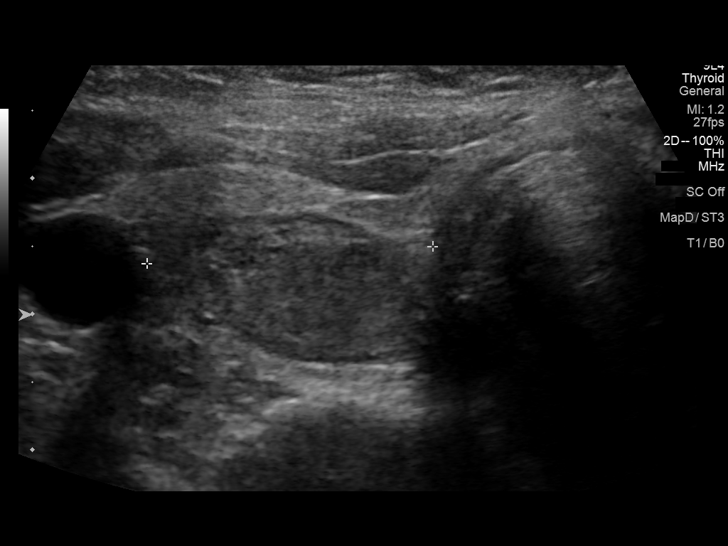
[im 29/43]
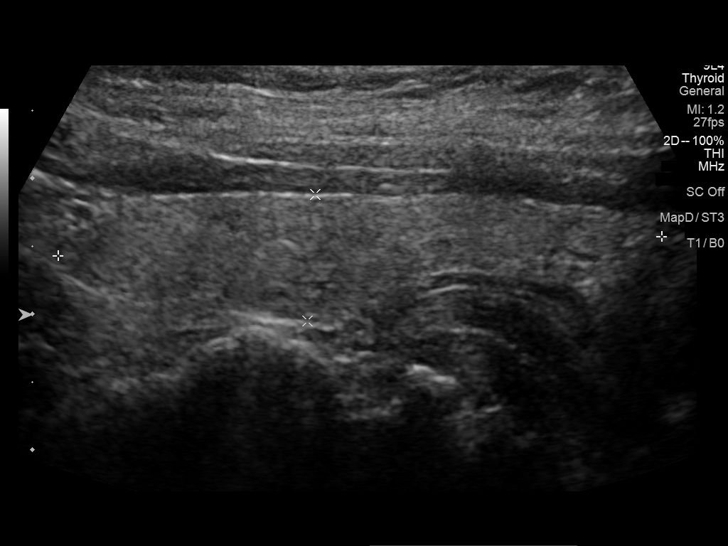
[im 32/43]
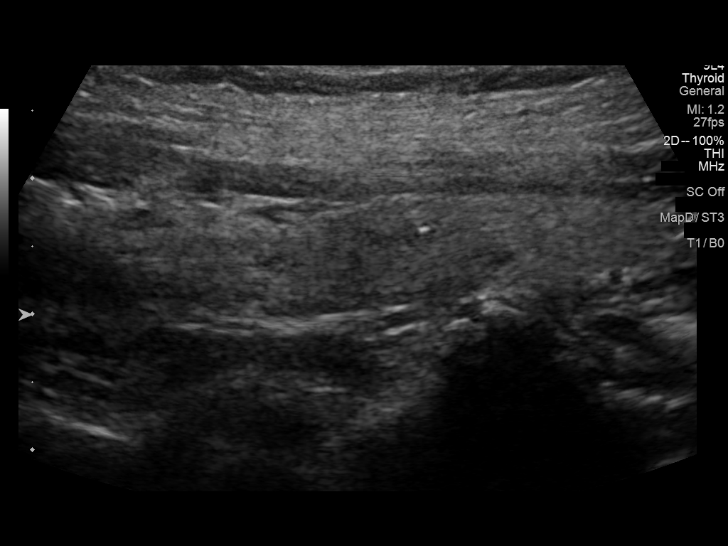
[im 36/43]
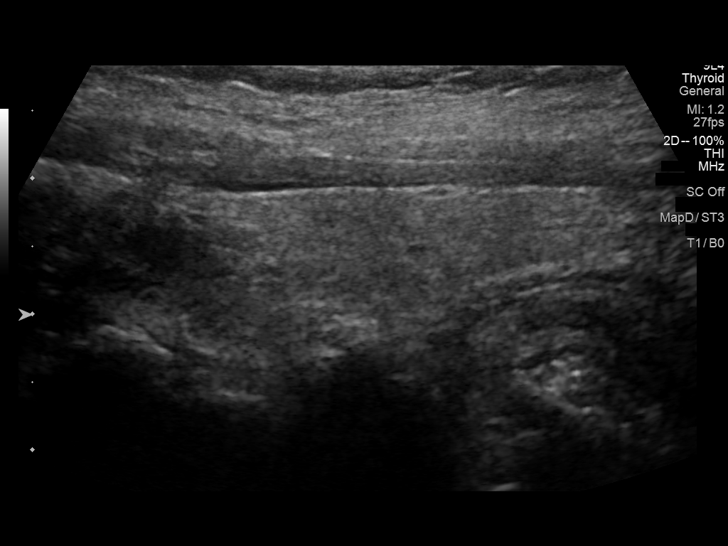
[im 39/43]
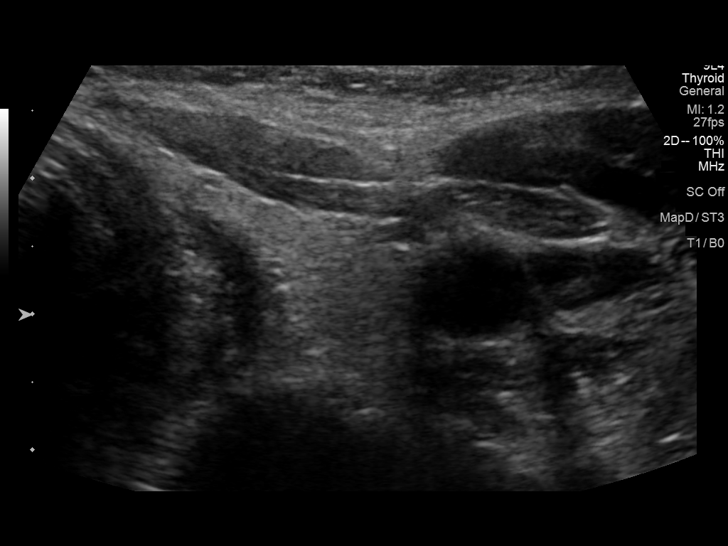
[im 43/43]
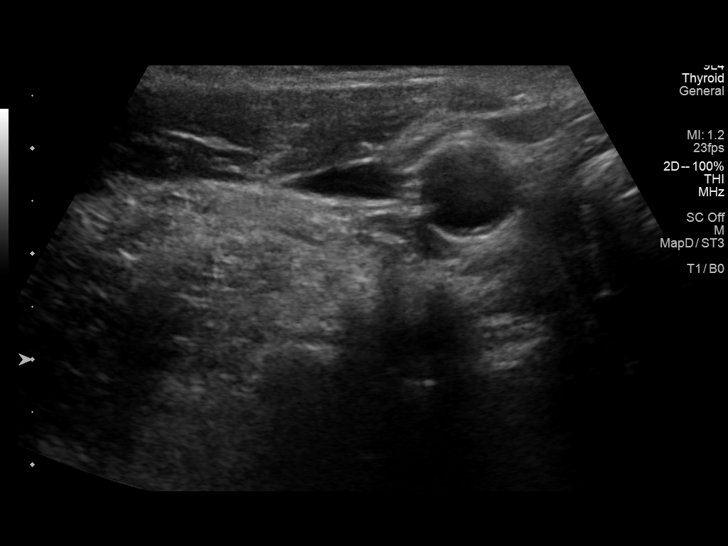

[13 of 25 positions shown; findings below may reference images not displayed]

FINDINGS: Parenchymal Echotexture: Mildly heterogenous

Isthmus: 0.2 cm

Right lobe: 5.1 x 2.1 x 2.1 cm

Left lobe: 4.5 x 0.9 x 1.3 cm

_________________________________________________________

Estimated total number of nodules >/= 1 cm: 2

Number of spongiform nodules >/=  2 cm not described below (TR1): 0

Number of mixed cystic and solid nodules >/= 1.5 cm not described
below (TR2): 0

_________________________________________________________

Nodule # 1:

Prior biopsy: No

Location: Right; Superior

Maximum size: 1.9 cm; Other 2 dimensions: 1.2 x 1.2 cm, previously,
1.8 x 1.2 x 1.0 cm

Composition: solid/almost completely solid (2)

Echogenicity: isoechoic (1)

Shape: not taller-than-wide (0)

Margins: smooth (0)

Echogenic foci: none (0)

ACR TI-RADS total points: 3.

ACR TI-RADS risk category:  TR3 (3 points).

Significant change in size (>/= 20% in two dimensions and minimal
increase of 2 mm): No

Change in features: No

Change in ACR TI-RADS risk category: No

ACR TI-RADS recommendations:

*Given size (>/= 1.5 - 2.4 cm) and appearance, a follow-up
ultrasound in 1 year should be considered based on TI-RADS criteria.

_________________________________________________________

Nodule # 2:

Prior biopsy: No

Location: Right; Mid

Maximum size: 1.5 cm; Other 2 dimensions: 1.4 x 1.1 cm, previously,
1.5 x 1.4 x 1.2 cm

Composition: solid/almost completely solid (2)

Echogenicity: isoechoic (1)

Shape: not taller-than-wide (0)

Margins: smooth (0)

Echogenic foci: none (0)

ACR TI-RADS total points: 3.

ACR TI-RADS risk category:  TR3 (3 points).

Significant change in size (>/= 20% in two dimensions and minimal
increase of 2 mm): No

Change in features: No

Change in ACR TI-RADS risk category: No

ACR TI-RADS recommendations:

*Given size (>/= 1.5 - 2.4 cm) and appearance, a follow-up
ultrasound in 1 year should be considered based on TI-RADS criteria.

_________________________________________________________
IMPRESSION: 1. No interval change in the size or appearance of TI-RADS category
3 nodules in the right mid to upper and right mid gland. These
nodules meet criteria for imaging surveillance. Recommend follow-up
ultrasound every 1-2 years until 5 year stability has been
confirmed.
2. Present study documents 5.5 months of stability.

The above is in keeping with the ACR TI-RADS recommendations - [HOSPITAL] [US];[DATE].

## 2023-03-27 ENCOUNTER — Other Ambulatory Visit: Payer: Self-pay | Admitting: Endocrinology

## 2023-03-27 DIAGNOSIS — E049 Nontoxic goiter, unspecified: Secondary | ICD-10-CM

## 2023-04-08 ENCOUNTER — Ambulatory Visit
Admission: RE | Admit: 2023-04-08 | Discharge: 2023-04-08 | Disposition: A | Discharge status: Home / Self Care | Payer: Managed Care, Other (non HMO) | Source: Ambulatory Visit | Attending: Endocrinology | Admitting: Endocrinology

## 2023-04-08 DIAGNOSIS — E049 Nontoxic goiter, unspecified: Secondary | ICD-10-CM

## 2023-04-10 ENCOUNTER — Other Ambulatory Visit: Payer: Self-pay | Admitting: Endocrinology

## 2023-04-10 DIAGNOSIS — E049 Nontoxic goiter, unspecified: Secondary | ICD-10-CM

## 2023-04-25 ENCOUNTER — Other Ambulatory Visit: Payer: Self-pay | Admitting: Endocrinology

## 2023-04-25 DIAGNOSIS — E049 Nontoxic goiter, unspecified: Secondary | ICD-10-CM

## 2023-05-02 ENCOUNTER — Ambulatory Visit
Admission: RE | Admit: 2023-05-02 | Discharge: 2023-05-02 | Disposition: A | Discharge status: Home / Self Care | Payer: Managed Care, Other (non HMO) | Source: Ambulatory Visit | Attending: Endocrinology | Admitting: Endocrinology

## 2023-05-02 ENCOUNTER — Other Ambulatory Visit (HOSPITAL_COMMUNITY)
Admission: RE | Admit: 2023-05-02 | Discharge: 2023-05-02 | Disposition: A | Discharge status: Home / Self Care | Payer: Managed Care, Other (non HMO) | Source: Ambulatory Visit | Attending: Interventional Radiology | Admitting: Interventional Radiology

## 2023-05-02 DIAGNOSIS — E049 Nontoxic goiter, unspecified: Secondary | ICD-10-CM

## 2023-05-02 DIAGNOSIS — E041 Nontoxic single thyroid nodule: Secondary | ICD-10-CM | POA: Diagnosis present

## 2023-07-22 ENCOUNTER — Ambulatory Visit (INDEPENDENT_AMBULATORY_CARE_PROVIDER_SITE_OTHER): Payer: Managed Care, Other (non HMO)

## 2023-07-22 ENCOUNTER — Ambulatory Visit: Payer: Managed Care, Other (non HMO) | Admitting: Podiatry

## 2023-07-22 DIAGNOSIS — M205X1 Other deformities of toe(s) (acquired), right foot: Secondary | ICD-10-CM | POA: Diagnosis not present

## 2023-07-22 DIAGNOSIS — M7751 Other enthesopathy of right foot: Secondary | ICD-10-CM | POA: Diagnosis not present

## 2023-07-22 DIAGNOSIS — L608 Other nail disorders: Secondary | ICD-10-CM | POA: Diagnosis not present

## 2023-07-22 NOTE — Patient Instructions (Signed)
If was nice to meet you today. If you have any questions or any further concerns, please feel fee to give me a call. You can call our office at [phone] or please feel fee to send me a message through MyChart.    You can start a BIOTIN SUPPLEMENT to help get the nail to grow out. Watch for any signs of infection such as increased pain, swelling, redness or drainage around the toenail. If anything occurs, please let me know.   You can use VOLTAREN GEL on the joint    Practice bunion exercises while barefoot. Begin the following exercises and stretches by sitting in a comfortable chair with your feet flat on the floor, unless stated otherwise.   1. Big toe circles  Toe circles work the joint in your big toe. They're designed to improve joint mobility and reduce pain and pressure.   Step 1: Cross one leg over the other and grab your big toe.  Step 2: Use your hands to gently rotate your big toe in small circles.  Step 3: Perform 20 times in one direction, then switch and perform 20 times in the opposite direction.  Step 4: Repeat on the other foot.  2. Toe spread-outs Toe spread-outs can strengthen the small muscles that allow you to spread your toes, and they can help prevent a bunion from forming. Keeping your toes moving can also prevent stiffness, which may lessen the likelihood of arthritis.  Step 1: Extend one leg straight in front of you.  Step 2: Keep the ball of your foot on the floor as you lift your toes up and spread them apart.  Step 3: Hold for 5 seconds, then relax.  Step 4: Repeat 20-30 times on each foot.  3. Toe curl and point The toe curl and point exercise works muscles in your feet and toes simultaneously. The targeted muscles help support your foot arch and toe mobility with walking, which can be limited by bunions.   Step 1: Extend one leg straight and lift your foot from the floor. Step 2: Squeeze the foot muscles in your extended leg to tighten your arch, and  point your toes down. Step 3: Hold for 5 seconds, then relax.  Step 4: Repeat 20-30 times on each foot.  4. Big toe extension This stretch isolates and extends muscles in the big toe to restore movement. Extending your big toe helps you walk and maintain your balance.   Step 1: Lift your big toe while keeping your other four toes on the floor. Try to keep your heel and the ball of your foot on the floor, too.  Step 2: Hold for 5 seconds, then relax.  Step 3: Repeat 20-30 times on each foot.  5. Heel raises Heel raises are a great way to strengthen calf, ankle, and foot muscles. They can make it easier to extend your toe while in weight-bearing positions. This motion allows your foot to push off when transferring your weight from one foot to the other while walking.   Step 1:  Stand behind a sturdy chair with your knees slightly bent. You can hold the back of the chair for extra support.  Step 2: Keep your toes and the ball of your foot on the floor. Lift your heel as high as you can while pressing your toes into the floor. You should feel movement through your ankles and toes. Step 3: Hold for 5 seconds, then relax.  Step 4: Repeat 20-30 times on  each foot.  6. Towel scrunches Practicing towel scrunches can strengthen muscles in your toes and the arch of your foot for better movement.   Step 1: Sit in a chair with your feet flat on top of a towel. Step 2: Curl the toes on one foot to grasp and scrunch the towel between your toes. Step 3: Hold for 5 seconds, then relax.  Step 4: Repeat 20-30 times on each foot.  7. Toe abduction Abduction refers to movement away from the body. Raising your arms out to the side is an example of abduction.   Working on toe abduction, or moving the big toes away from the other toes, can help improve alignment and mobility.   Step 1: Keep your feet flat on the floor as you spread your toes outward. Your big toe may move outward, but your other toes might not  move as much.  Step 2: Hold for 5 seconds, then relax.  Step 3: Repeat 15-20 times on each foot.

## 2023-07-24 NOTE — Progress Notes (Signed)
  Subjective:  Patient ID: Abigail Love, female    DOB: 11-14-1960,  MRN: [id number]  No chief complaint on file.   Discussed the use of AI scribe software for clinical note transcription with the patient, who gave verbal consent to proceed.  History of Present Illness          62 year old female with a history of a toenail fungus, presents with pain in the right foot, specifically around the toe and joint area, pointing to the first MTPJ. The pain is described as shooting and intermittent, occurring even at rest. The patient also reports a sensation of numbness in the affected area. The onset of these symptoms was not associated with any known injury. The patient has noticed a change in her gait due to the discomfort, which has prompted her to seek medical attention.  She is going to roll her foot to the outside because of this.  The patient also mentions a previous issue with a toenail fungus, but it's unclear if this is related to the current problem.   Objective:    Physical Exam          General: AAO x3, NAD  Dermatological: On the right foot hallux toenail on the corner there is 1 small darkened discoloration spot.  I think she is more from likely dried blood under the toenail.  There is no edema, erythema, drainage or pus or signs of infection.  No pain.  No open lesions.  No extension of hyperpigmentation of the surrounding skin.  Vascular: Dorsalis Pedis artery and Posterior Tibial artery pedal pulses are 2/4 bilateral with immedate capillary fill time.  There is no pain with calf compression, swelling, warmth, erythema.   Neruologic: Grossly intact via light touch bilateral.  Musculoskeletal: There is decreased range of motion of first MTPJ and there is pain with MPJ range of motion.  Dorsal spurring present at the first MTPJ.  Minimal edema.  No erythema.  No other areas of discomfort. Gait: Unassisted, Nonantalgic.   No images are attached to the encounter.    Results           Assessment:   1. Adhesive capsulitis of toe, right   2. Capsulitis of toe, right      Plan:  Patient was evaluated and treated and all questions answered.  Assessment and Plan          Foot Pain and Arthritis -X-rays obtained reviewed.  3 views of the foot were obtained.  Decreased joint spacing on the first MTPJ there is spurring present of the dorsal first metatarsal. -Apply Voltaren gel up to four times a day for inflammation. -Perform exercises to help with MPJ range of motion. -Consider wearing shoes with good arch support and avoid going barefoot at home to reduce pressure on the joint. -We discussed custom orthotics but she states that she is not going to wear them given her shoe choices.  Discussed wearing supportive shoe gear. -Return for follow-up if symptoms worsen.  Toenail Issue Possible previous fungal infection, with a current dark spot on the toenail. No current signs of fungus. -Apply tea tree oil and/or take a biotin supplement to promote healthy nail growth. -Monitor the dark spot on the toenail, expecting it to grow out over time.  If this is not improving recommend biopsy/removal.  -Return for follow-up if the toenail condition worsens.  Return if symptoms worsen or fail to improve.    Vivi Barrack DPM

## 2024-03-19 ENCOUNTER — Other Ambulatory Visit: Payer: Self-pay | Admitting: Endocrinology

## 2024-03-19 DIAGNOSIS — E049 Nontoxic goiter, unspecified: Secondary | ICD-10-CM

## 2024-03-26 ENCOUNTER — Ambulatory Visit
Admission: RE | Admit: 2024-03-26 | Discharge: 2024-03-26 | Disposition: A | Discharge status: Home / Self Care | Source: Ambulatory Visit | Attending: Endocrinology | Admitting: Endocrinology

## 2024-03-26 DIAGNOSIS — E049 Nontoxic goiter, unspecified: Secondary | ICD-10-CM
